# Patient Record
Sex: Female | Born: 1995 | Race: White | Hispanic: No | Smoking: Never smoker
Health system: Southern US, Community
[De-identification: ages and names within clinical notes are randomized; demographics above are authoritative.]

## PROBLEM LIST (undated history)

## (undated) DIAGNOSIS — D649 Anemia, unspecified: Secondary | ICD-10-CM

## (undated) DIAGNOSIS — R112 Nausea with vomiting, unspecified: Secondary | ICD-10-CM

## (undated) DIAGNOSIS — N83209 Unspecified ovarian cyst, unspecified side: Secondary | ICD-10-CM

## (undated) DIAGNOSIS — T8859XA Other complications of anesthesia, initial encounter: Secondary | ICD-10-CM

## (undated) DIAGNOSIS — Z8489 Family history of other specified conditions: Secondary | ICD-10-CM

## (undated) DIAGNOSIS — T4145XA Adverse effect of unspecified anesthetic, initial encounter: Secondary | ICD-10-CM

## (undated) DIAGNOSIS — R5383 Other fatigue: Secondary | ICD-10-CM

## (undated) DIAGNOSIS — F32A Depression, unspecified: Secondary | ICD-10-CM

## (undated) DIAGNOSIS — N6310 Unspecified lump in the right breast, unspecified quadrant: Secondary | ICD-10-CM

## (undated) DIAGNOSIS — Z8614 Personal history of Methicillin resistant Staphylococcus aureus infection: Secondary | ICD-10-CM

## (undated) DIAGNOSIS — S43109A Unspecified dislocation of unspecified acromioclavicular joint, initial encounter: Secondary | ICD-10-CM

## (undated) DIAGNOSIS — E785 Hyperlipidemia, unspecified: Secondary | ICD-10-CM

## (undated) DIAGNOSIS — R519 Headache, unspecified: Secondary | ICD-10-CM

## (undated) DIAGNOSIS — K529 Noninfective gastroenteritis and colitis, unspecified: Secondary | ICD-10-CM

## (undated) DIAGNOSIS — I880 Nonspecific mesenteric lymphadenitis: Secondary | ICD-10-CM

## (undated) DIAGNOSIS — R0602 Shortness of breath: Secondary | ICD-10-CM

## (undated) DIAGNOSIS — Z9889 Other specified postprocedural states: Secondary | ICD-10-CM

## (undated) DIAGNOSIS — E039 Hypothyroidism, unspecified: Secondary | ICD-10-CM

## (undated) DIAGNOSIS — F419 Anxiety disorder, unspecified: Secondary | ICD-10-CM

## (undated) DIAGNOSIS — Z91018 Allergy to other foods: Secondary | ICD-10-CM

## (undated) DIAGNOSIS — R51 Headache: Secondary | ICD-10-CM

## (undated) DIAGNOSIS — E781 Pure hyperglyceridemia: Secondary | ICD-10-CM

## (undated) DIAGNOSIS — S43005A Unspecified dislocation of left shoulder joint, initial encounter: Secondary | ICD-10-CM

## (undated) DIAGNOSIS — Z87898 Personal history of other specified conditions: Secondary | ICD-10-CM

## (undated) DIAGNOSIS — J45909 Unspecified asthma, uncomplicated: Secondary | ICD-10-CM

## (undated) DIAGNOSIS — R55 Syncope and collapse: Secondary | ICD-10-CM

## (undated) HISTORY — PX: MYRINGOTOMY: SUR874

## (undated) HISTORY — DX: Hypothyroidism, unspecified: E03.9

## (undated) HISTORY — DX: Depression, unspecified: F32.A

## (undated) HISTORY — DX: Anxiety disorder, unspecified: F41.9

## (undated) HISTORY — DX: Shortness of breath: R06.02

## (undated) HISTORY — DX: Hyperlipidemia, unspecified: E78.5

## (undated) HISTORY — PX: OTHER SURGICAL HISTORY: SHX169

## (undated) HISTORY — PX: BREAST SURGERY: SHX581

## (undated) HISTORY — DX: Other fatigue: R53.83

## (undated) HISTORY — DX: Allergy to other foods: Z91.018

## (undated) HISTORY — PX: ADENOIDECTOMY: SUR15

---
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
## 2017-01-17 ENCOUNTER — Encounter (HOSPITAL_COMMUNITY): Payer: Self-pay | Admitting: Emergency Medicine

## 2017-01-17 DIAGNOSIS — G43909 Migraine, unspecified, not intractable, without status migrainosus: Secondary | ICD-10-CM | POA: Insufficient documentation

## 2017-01-17 DIAGNOSIS — R509 Fever, unspecified: Secondary | ICD-10-CM | POA: Insufficient documentation

## 2017-01-17 DIAGNOSIS — R197 Diarrhea, unspecified: Secondary | ICD-10-CM | POA: Diagnosis not present

## 2017-01-17 DIAGNOSIS — I880 Nonspecific mesenteric lymphadenitis: Secondary | ICD-10-CM | POA: Insufficient documentation

## 2017-01-17 DIAGNOSIS — R112 Nausea with vomiting, unspecified: Secondary | ICD-10-CM | POA: Diagnosis not present

## 2017-01-17 DIAGNOSIS — Z79899 Other long term (current) drug therapy: Secondary | ICD-10-CM | POA: Insufficient documentation

## 2017-01-17 DIAGNOSIS — R1031 Right lower quadrant pain: Secondary | ICD-10-CM | POA: Diagnosis present

## 2017-01-17 LAB — COMPREHENSIVE METABOLIC PANEL
ALK PHOS: 46 U/L (ref 38–126)
ALT: 16 U/L (ref 14–54)
AST: 15 U/L (ref 15–41)
Albumin: 3.9 g/dL (ref 3.5–5.0)
Anion gap: 10 (ref 5–15)
BUN: 9 mg/dL (ref 6–20)
CHLORIDE: 103 mmol/L (ref 101–111)
CO2: 25 mmol/L (ref 22–32)
Calcium: 9.4 mg/dL (ref 8.9–10.3)
Creatinine, Ser: 0.65 mg/dL (ref 0.44–1.00)
GFR calc Af Amer: >60 mL/min (ref >60)
GFR calc non Af Amer: >60 mL/min (ref >60)
GLUCOSE: 107 mg/dL — AB (ref 65–99)
POTASSIUM: 3.9 mmol/L (ref 3.5–5.1)
Sodium: 138 mmol/L (ref 135–145)
Total Bilirubin: 0.3 mg/dL (ref 0.3–1.2)
Total Protein: 6.9 g/dL (ref 6.5–8.1)

## 2017-01-17 LAB — URINALYSIS, ROUTINE W REFLEX MICROSCOPIC
BACTERIA UA: NONE SEEN
BILIRUBIN URINE: NEGATIVE
Glucose, UA: NEGATIVE mg/dL
Ketones, ur: NEGATIVE mg/dL
Leukocytes, UA: NEGATIVE
Nitrite: NEGATIVE
Protein, ur: NEGATIVE mg/dL
Specific Gravity, Urine: 1.018 (ref 1.005–1.030)
Squamous Epithelial / LPF: NONE SEEN
pH: 5 (ref 5.0–8.0)

## 2017-01-17 LAB — CBC
HEMATOCRIT: 41.4 % (ref 36.0–46.0)
Hemoglobin: 14.1 g/dL (ref 12.0–15.0)
MCH: 31 pg (ref 26.0–34.0)
MCHC: 34.1 g/dL (ref 30.0–36.0)
MCV: 91 fL (ref 78.0–100.0)
Platelets: 273 10*3/uL (ref 150–400)
RBC: 4.55 MIL/uL (ref 3.87–5.11)
RDW: 11.7 % (ref 11.5–15.5)
WBC: 6.7 10*3/uL (ref 4.0–10.5)

## 2017-01-17 LAB — I-STAT BETA HCG BLOOD, ED (MC, WL, AP ONLY): I-stat hCG, quantitative: <5 m[IU]/mL (ref <5)

## 2017-01-17 LAB — LIPASE, BLOOD: Lipase: 30 U/L (ref 11–51)

## 2017-01-17 NOTE — ED Triage Notes (Signed)
Patient reports migraine headache yesterday and RLQ pain today with emesis and diarrhea , fever yesterday with fatigue .

## 2017-01-18 ENCOUNTER — Emergency Department (HOSPITAL_COMMUNITY)
Admission: EM | Admit: 2017-01-18 | Discharge: 2017-01-18 | Disposition: A | Discharge status: Home / Self Care | Payer: BLUE CROSS/BLUE SHIELD | Attending: Emergency Medicine | Admitting: Emergency Medicine

## 2017-01-18 ENCOUNTER — Emergency Department (HOSPITAL_COMMUNITY): Payer: BLUE CROSS/BLUE SHIELD

## 2017-01-18 DIAGNOSIS — I880 Nonspecific mesenteric lymphadenitis: Secondary | ICD-10-CM

## 2017-01-18 HISTORY — DX: Nonspecific mesenteric lymphadenitis: I88.0

## 2017-01-18 MED ORDER — IBUPROFEN 800 MG PO TABS
800.0000 mg | ORAL_TABLET | Freq: Once | ORAL | Status: AC
  Administered 2017-01-18: 800 mg via ORAL
  Filled 2017-01-18: qty 1

## 2017-01-18 MED ORDER — SODIUM CHLORIDE 0.9 % IV BOLUS (SEPSIS)
1000.0000 mL | Freq: Once | INTRAVENOUS | Status: AC
  Administered 2017-01-18: 1000 mL via INTRAVENOUS

## 2017-01-18 MED ORDER — IBUPROFEN 800 MG PO TABS: 800.0000 mg | ORAL_TABLET | Freq: Three times a day (TID) | ORAL | 0 refills | Status: DC

## 2017-01-18 MED ORDER — IOPAMIDOL (ISOVUE-300) INJECTION 61%
INTRAVENOUS | Status: AC
  Administered 2017-01-18: 100 mL
  Filled 2017-01-18: qty 100

## 2017-01-18 MED ORDER — ONDANSETRON HCL 4 MG/2ML IJ SOLN
4.0000 mg | Freq: Once | INTRAMUSCULAR | Status: AC
  Administered 2017-01-18: 4 mg via INTRAVENOUS
  Filled 2017-01-18: qty 2

## 2017-01-18 MED ORDER — MORPHINE SULFATE (PF) 4 MG/ML IV SOLN
4.0000 mg | Freq: Once | INTRAVENOUS | Status: AC
  Administered 2017-01-18: 4 mg via INTRAVENOUS
  Filled 2017-01-18: qty 1
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
## 2017-01-18 NOTE — ED Notes (Signed)
Pt returned from CT.  States that she feels much better.

## 2017-01-18 NOTE — ED Notes (Signed)
Called CT to see where pt is on the list. CT states that they are next.

## 2017-01-18 NOTE — ED Notes (Signed)
Patient transported to CT 

## 2017-04-11 ENCOUNTER — Encounter (HOSPITAL_COMMUNITY): Payer: Self-pay | Admitting: Emergency Medicine

## 2017-04-11 DIAGNOSIS — Z5321 Procedure and treatment not carried out due to patient leaving prior to being seen by health care provider: Secondary | ICD-10-CM | POA: Insufficient documentation

## 2017-04-11 DIAGNOSIS — R51 Headache: Secondary | ICD-10-CM | POA: Diagnosis present
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
## 2017-04-12 ENCOUNTER — Emergency Department (HOSPITAL_COMMUNITY)
Admission: EM | Admit: 2017-04-12 | Discharge: 2017-04-12 | Disposition: A | Discharge status: Home / Self Care | Payer: BLUE CROSS/BLUE SHIELD | Attending: Emergency Medicine | Admitting: Emergency Medicine

## 2017-11-06 ENCOUNTER — Encounter (HOSPITAL_COMMUNITY): Payer: Self-pay

## 2017-11-06 ENCOUNTER — Emergency Department (HOSPITAL_COMMUNITY): Payer: BLUE CROSS/BLUE SHIELD

## 2017-11-06 ENCOUNTER — Emergency Department (HOSPITAL_COMMUNITY)
Admission: EM | Admit: 2017-11-06 | Discharge: 2017-11-07 | Disposition: A | Discharge status: Home / Self Care | Payer: BLUE CROSS/BLUE SHIELD | Attending: Emergency Medicine | Admitting: Emergency Medicine

## 2017-11-06 DIAGNOSIS — K529 Noninfective gastroenteritis and colitis, unspecified: Secondary | ICD-10-CM | POA: Insufficient documentation

## 2017-11-06 DIAGNOSIS — R109 Unspecified abdominal pain: Secondary | ICD-10-CM | POA: Diagnosis present

## 2017-11-06 DIAGNOSIS — Z79899 Other long term (current) drug therapy: Secondary | ICD-10-CM | POA: Diagnosis not present

## 2017-11-06 DIAGNOSIS — A084 Viral intestinal infection, unspecified: Secondary | ICD-10-CM

## 2017-11-06 LAB — COMPREHENSIVE METABOLIC PANEL
ALT: 25 U/L (ref 0–44)
AST: 19 U/L (ref 15–41)
Albumin: 4.3 g/dL (ref 3.5–5.0)
Alkaline Phosphatase: 55 U/L (ref 38–126)
Anion gap: 7 (ref 5–15)
BUN: 9 mg/dL (ref 6–20)
CO2: 25 mmol/L (ref 22–32)
Calcium: 9.3 mg/dL (ref 8.9–10.3)
Chloride: 106 mmol/L (ref 98–111)
Creatinine, Ser: 0.69 mg/dL (ref 0.44–1.00)
GFR calc Af Amer: >60 mL/min (ref >60)
GFR calc non Af Amer: >60 mL/min (ref >60)
Glucose, Bld: 86 mg/dL (ref 70–99)
Potassium: 3.6 mmol/L (ref 3.5–5.1)
Sodium: 138 mmol/L (ref 135–145)
Total Bilirubin: 0.7 mg/dL (ref 0.3–1.2)
Total Protein: 7.2 g/dL (ref 6.5–8.1)

## 2017-11-06 LAB — URINALYSIS, ROUTINE W REFLEX MICROSCOPIC
Bilirubin Urine: NEGATIVE
Glucose, UA: NEGATIVE mg/dL
Hgb urine dipstick: NEGATIVE
Ketones, ur: NEGATIVE mg/dL
Leukocytes, UA: NEGATIVE
Nitrite: NEGATIVE
Protein, ur: NEGATIVE mg/dL
Specific Gravity, Urine: 1.015 (ref 1.005–1.030)
pH: 5 (ref 5.0–8.0)

## 2017-11-06 LAB — CBC WITH DIFFERENTIAL/PLATELET
Abs Immature Granulocytes: 0 10*3/uL (ref 0.0–0.1)
Basophils Absolute: 0.1 10*3/uL (ref 0.0–0.1)
Basophils Relative: 1 %
Eosinophils Absolute: 0.1 10*3/uL (ref 0.0–0.7)
Eosinophils Relative: 1 %
HCT: 41 % (ref 36.0–46.0)
Hemoglobin: 13.2 g/dL (ref 12.0–15.0)
Immature Granulocytes: 0 %
Lymphocytes Relative: 28 %
Lymphs Abs: 2.2 10*3/uL (ref 0.7–4.0)
MCH: 28.7 pg (ref 26.0–34.0)
MCHC: 32.2 g/dL (ref 30.0–36.0)
MCV: 89.1 fL (ref 78.0–100.0)
Monocytes Absolute: 0.5 10*3/uL (ref 0.1–1.0)
Monocytes Relative: 6 %
Neutro Abs: 4.9 10*3/uL (ref 1.7–7.7)
Neutrophils Relative %: 64 %
Platelets: 296 10*3/uL (ref 150–400)
RBC: 4.6 MIL/uL (ref 3.87–5.11)
RDW: 12.7 % (ref 11.5–15.5)
WBC: 7.7 10*3/uL (ref 4.0–10.5)

## 2017-11-06 LAB — POC OCCULT BLOOD, ED: Fecal Occult Bld: NEGATIVE

## 2017-11-06 LAB — LIPASE, BLOOD: Lipase: 34 U/L (ref 11–51)

## 2017-11-06 LAB — I-STAT BETA HCG BLOOD, ED (MC, WL, AP ONLY): I-stat hCG, quantitative: <5 m[IU]/mL (ref <5)

## 2017-11-06 MED ORDER — MORPHINE SULFATE (PF) 4 MG/ML IV SOLN
4.0000 mg | Freq: Once | INTRAVENOUS | Status: AC
  Administered 2017-11-06: 4 mg via INTRAVENOUS
  Filled 2017-11-06: qty 1

## 2017-11-06 MED ORDER — ONDANSETRON 4 MG PO TBDP
4.0000 mg | ORAL_TABLET | Freq: Once | ORAL | Status: AC
  Administered 2017-11-06: 4 mg via ORAL
  Filled 2017-11-06: qty 1

## 2017-11-06 MED ORDER — IOHEXOL 300 MG/ML  SOLN
100.0000 mL | Freq: Once | INTRAMUSCULAR | Status: AC | PRN
Start: 2017-11-06 — End: 2017-11-06
  Administered 2017-11-06: 100 mL via INTRAVENOUS

## 2017-11-06 NOTE — ED Provider Notes (Signed)
[facility] EMERGENCY DEPARTMENT Provider Note   CSN: [id number] Arrival date & time: 11/06/17  1844     History   Chief Complaint Chief Complaint  Patient presents with  . Abdominal Pain  . Blood In Stools    HPI Dawn Hartman is a 22 y.o. female.   22 year old female with a history of ovarian cyst presents to the emergency department for 2 days of right-sided abdominal pain.  Pain has been sharp and waxing and waning in severity.  It has been associated with nausea, vomiting, diarrhea.  She reports that her last episode of emesis was around 1400.  She denies any bloody or bilious emesis, but has had some bright red blood streaked with brown, watery stool.  Denies any recent travel or associated fevers.  No questionable food ingestion.  She took over-the-counter medication for pain without relief.  No urinary symptoms, history of abdominal surgeries, sick contacts.     Past Medical History:  Diagnosis Date  . Breast mass, right 03/2012  . High triglycerides    takes fish oil supplement  . History of MRSA infection age 70   finger  . Ovarian cyst     There are no active problems to display for this patient.   Past Surgical History:  Procedure Laterality Date  . ADENOIDECTOMY    . BREAST LUMPECTOMY WITH NEEDLE LOCALIZATION  04/15/2012   Procedure: BREAST LUMPECTOMY WITH NEEDLE LOCALIZATION;  Surgeon: Rolm Bookbinder, MD;  Location: [facility];  Service: General;  Laterality: Right;  right breast wire localized mass excision  . MYRINGOTOMY  04/1999; 04/2002  . TONSILLECTOMY  03/2010     OB History   None      Home Medications    Prior to Admission medications   Medication Sig Start Date End Date Taking? Authorizing Provider  dicyclomine (BENTYL) 20 MG tablet Take 1 tablet (20 mg total) by mouth every 12 (twelve) hours as needed (for abdominal pain/cramping). 11/07/17   Antonietta Breach, PA-C  ibuprofen (ADVIL,MOTRIN) 800 MG tablet  Take 1 tablet (800 mg total) by mouth 3 (three) times daily. 01/18/17   Montine Circle, PA-C  loratadine (CLARITIN) 10 MG tablet Take 10 mg by mouth daily.    [provider]  Norethin Ace-Eth Estrad-FE (MELODETTA 24 FE) 1-20 MG-MCG(24) CHEW Chew 1 tablet by mouth daily.    [provider]  ondansetron (ZOFRAN ODT) 4 MG disintegrating tablet Take 1 tablet (4 mg total) by mouth every 8 (eight) hours as needed for nausea or vomiting. 11/07/17   Antonietta Breach, PA-C  PRESCRIPTION MEDICATION Take 4 tablets by mouth at bedtime. Headache medication    [provider]    Family History Family History  Problem Relation Age of Onset  . Heart disease Paternal Grandfather        atrial fib.  . Asthma Paternal Grandfather        as a child  . Diabetes Maternal Grandmother   . Hypertension Maternal Grandmother   . Leukemia Maternal Grandfather   . Cancer Paternal Grandmother        breast CA  . Anesthesia problems Mother        post-op N/V  . Asthma Sister     Social History Social History   Tobacco Use  . Smoking status: Never Smoker  . Smokeless tobacco: Never Used  Substance Use Topics  . Alcohol use: No  . Drug use: No     Allergies   Patient  has no known allergies.   Review of Systems Review of Systems Ten systems reviewed and are negative for acute change, except as noted in the HPI.    Physical Exam Updated Vital Signs BP 102/62   Pulse 86   Temp 98.9 F (37.2 C) (Oral)   Resp 17   LMP 10/22/2017   SpO2 99%   Physical Exam  Constitutional: She is oriented to person, place, and time. She appears well-developed and well-nourished. No distress.  Nontoxic appearing and in NAD  HENT:  Head: Normocephalic and atraumatic.  Eyes: Conjunctivae and EOM are normal. No scleral icterus.  Neck: Normal range of motion.  Cardiovascular: Normal rate, regular rhythm and intact distal pulses.  Pulmonary/Chest: Effort normal. No respiratory distress.    Respirations even and unlabored  Abdominal: She exhibits no mass. There is tenderness.  Soft abdomen. Slightly hyperactive bowel sounds. TTP in the RLQ, mildly in the RUQ. Negative Murphy's sign. No peritoneal signs.  Genitourinary: Rectum normal.  Genitourinary Comments: No anal fissure or bleeding hemorrhoid. Normal rectal tone. No melena or hematochezia on chaperoned DRE.   Musculoskeletal: Normal range of motion.  Neurological: She is alert and oriented to person, place, and time. She exhibits normal muscle tone. Coordination normal.  GCS 15. Moving all extremities spontaneously.  Skin: Skin is warm and dry. No rash noted. She is not diaphoretic. No erythema. No pallor.  Psychiatric: She has a normal mood and affect. Her behavior is normal.  Nursing note and vitals reviewed.    ED Treatments / Results  Labs (all labs ordered are listed, but only abnormal results are displayed) Labs Reviewed  COMPREHENSIVE METABOLIC PANEL  LIPASE, BLOOD  CBC WITH DIFFERENTIAL/PLATELET  URINALYSIS, ROUTINE W REFLEX MICROSCOPIC  I-STAT BETA HCG BLOOD, ED ([facility], [facility], [facility] ONLY)  POC OCCULT BLOOD, ED    EKG None  Radiology Ct Abdomen Pelvis W Contrast  Result Date: 11/07/2017 CLINICAL DATA:  22 y/o F; right lower quadrant abdominal pain with nausea, vomiting, diarrhea, and hematochezia for 2 days. EXAM: CT ABDOMEN AND PELVIS WITH CONTRAST TECHNIQUE: Multidetector CT imaging of the abdomen and pelvis was performed using the standard protocol following bolus administration of intravenous contrast. CONTRAST:  16mL OMNIPAQUE IOHEXOL 300 MG/ML  SOLN COMPARISON:  01/18/2017 CT of abdomen and pelvis. FINDINGS: Lower chest: No acute abnormality. Hepatobiliary: No focal liver abnormality is seen. No gallstones, gallbladder wall thickening, or biliary dilatation. Pancreas: Unremarkable. No pancreatic ductal dilatation or surrounding inflammatory changes. Spleen: Normal in size without focal abnormality.  Adrenals/Urinary Tract: Adrenal glands are unremarkable. Kidneys are normal, without renal calculi, focal lesion, or hydronephrosis. Bladder is unremarkable. Stomach/Bowel: Stomach is within normal limits. Appendix appears normal. No evidence of bowel wall thickening, distention, or inflammatory changes. Vascular/Lymphatic: No significant vascular findings are present. No enlarged abdominal or pelvic lymph nodes. Reproductive: Uterus and bilateral adnexa are unremarkable. Other: No abdominal wall hernia or abnormality. No abdominopelvic ascites. Musculoskeletal: No acute or significant osseous findings. IMPRESSION: Stable normal CT of the abdomen and pelvis. No acute process identified. Electronically Signed   By: Kristine Garbe M.D.   On: 11/07/2017 00:21    Procedures Procedures (including critical care time)  Medications Ordered in ED Medications  ondansetron (ZOFRAN-ODT) disintegrating tablet 4 mg (4 mg Oral Given 11/06/17 1953)  morphine 4 MG/ML injection 4 mg (4 mg Intravenous Given 11/06/17 1953)  iohexol (OMNIPAQUE) 300 MG/ML solution 100 mL (100 mLs Intravenous Contrast Given 11/06/17 2339)     Initial Impression / Assessment and  Plan / ED Course  I have reviewed the triage vital signs and the nursing notes.  Pertinent labs & imaging results that were available during my care of the patient were reviewed by me and considered in my medical decision making (see chart for details).     Patient with symptoms consistent with viral gastroenteritis.  Vitals are stable, no fever.  No signs of dehydration, tolerating PO fluids.  Lungs are clear.  Laboratory work up reassuring and CT without acute process in the abdomen or pelvis. No concern for appendicitis, cholecystitis, pancreatitis, ruptured viscus, UTI, kidney stone, or any other abdominal etiology.  Supportive therapy indicated with return if symptoms worsen.  Will discharge with Zofran and Bentyl.  Return precautions discussed and  provided. Patient discharged in stable condition with no unaddressed concerns.   Final Clinical Impressions(s) / ED Diagnoses   Final diagnoses:  Viral gastroenteritis    ED Discharge Orders        Ordered    ondansetron (ZOFRAN ODT) 4 MG disintegrating tablet  Every 8 hours PRN     11/07/17 0033    dicyclomine (BENTYL) 20 MG tablet  Every 12 hours PRN     11/07/17 0033       Antonietta Breach, PA-C 11/07/17 4403    Dina Rich, Barbette Hair, MD 11/07/17 [phone]

## 2017-11-06 NOTE — ED Triage Notes (Signed)
Onset yesterday blood in stool, abd pain on right side, onset last night vomiting- last 10:30am, and nausea.

## 2017-11-06 NOTE — ED Provider Notes (Signed)
Patient placed in Quick Look pathway, seen and evaluated   Chief Complaint: Abdominal Pain, N/V/D  HPI:   Patient presents with a 2 day history of worsening abdominal pain. It is mostly in the RLQ, however has had some radiation. She reports diarrhea and reports blood in her stool all yesterday. She reports "borderline appendicitis" in the past, as well as mesenteric adenitis, and ovarian cyst. No known fever. Sent from Urgent Care for further workup  ROS: Abdominal pain, N/V/D, rectal bleeding  Physical Exam:   Gen: No distress  Neuro: Awake and Alert  Skin: Warm    Focused Exam: McBurney's point tenderness as well as RUQ tenderness, +Rovsing's sign, suprapubic tenderness as well but most tender at RLQ; lungs CTA, heart normal RR   Initiation of care has begun. The patient has been counseled on the process, plan, and necessity for staying for the completion/evaluation, and the remainder of the medical screening examination    Caryl Ada 11/06/17 1933    Dorie Rank, MD 11/07/17 [phone]

## 2017-11-07 MED ORDER — METOCLOPRAMIDE HCL 5 MG/ML IJ SOLN: 10.0000 mg | INTRAMUSCULAR | Status: DC

## 2017-11-07 MED ORDER — ONDANSETRON 4 MG PO TBDP: 4.0000 mg | ORAL_TABLET | Freq: Three times a day (TID) | ORAL | 0 refills | Status: DC | PRN

## 2017-11-07 MED ORDER — KETOROLAC TROMETHAMINE 30 MG/ML IJ SOLN: 30.0000 mg | Freq: Once | INTRAMUSCULAR | Status: DC

## 2017-11-07 MED ORDER — DICYCLOMINE HCL 20 MG PO TABS: 20.0000 mg | ORAL_TABLET | Freq: Two times a day (BID) | ORAL | 0 refills | Status: DC | PRN

## 2017-11-07 NOTE — ED Notes (Signed)
Pt given crackers and sprite.

## 2017-11-07 NOTE — Discharge Instructions (Signed)
Your blood work and CT scan today were normal.  We suspect that your symptoms are due to a viral illness.  Avoid fried foods, fatty foods, greasy foods, and milk products until symptoms resolve. Be sure your child drinks plenty of clear liquids. We recommend the use of Zofran as prescribed for nausea/vomiting. Take 600mg  ibuprofen eery 6 hours for pain. You may supplement this with Bentyl as needed for abdominal cramping. Follow-up with your primary care doctor to ensure resolution of symptoms.

## 2017-11-28 ENCOUNTER — Encounter (HOSPITAL_COMMUNITY): Payer: Self-pay | Admitting: Emergency Medicine

## 2017-11-28 ENCOUNTER — Other Ambulatory Visit: Payer: Self-pay

## 2017-11-28 ENCOUNTER — Ambulatory Visit (HOSPITAL_COMMUNITY)
Admission: EM | Admit: 2017-11-28 | Discharge: 2017-11-28 | Disposition: A | Discharge status: Home / Self Care | Payer: BLUE CROSS/BLUE SHIELD | Attending: Family Medicine | Admitting: Family Medicine

## 2017-11-28 DIAGNOSIS — R040 Epistaxis: Secondary | ICD-10-CM

## 2017-11-28 DIAGNOSIS — R55 Syncope and collapse: Secondary | ICD-10-CM

## 2017-11-28 LAB — POCT I-STAT, CHEM 8
BUN: 12 mg/dL (ref 6–20)
CALCIUM ION: 1.25 mmol/L (ref 1.15–1.40)
Chloride: 103 mmol/L (ref 98–111)
Creatinine, Ser: 0.7 mg/dL (ref 0.44–1.00)
Glucose, Bld: 97 mg/dL (ref 70–99)
HEMATOCRIT: 40 % (ref 36.0–46.0)
HEMOGLOBIN: 13.6 g/dL (ref 12.0–15.0)
Potassium: 4.2 mmol/L (ref 3.5–5.1)
SODIUM: 139 mmol/L (ref 135–145)
TCO2: 26 mmol/L (ref 22–32)

## 2017-11-28 MED ORDER — OXYMETAZOLINE HCL 0.05 % NA SOLN
NASAL | Status: AC
  Filled 2017-11-28: qty 15

## 2017-11-28 NOTE — ED Provider Notes (Addendum)
[place]   [id number] 11/28/17 Arrival Time: 1320  ASSESSMENT & PLAN:  1. Epistaxis   2. Vasovagal episode    Reassured that I do not feel her isolated vasovagal episode without recurrence is predictive of a more serious condition. She will call her ENT to continue treating her recurrent nosebleeds.  Labs today unremarkable. Declines ECG. Should she have further episodes I would recommend f/u with cardiology.  Follow-up Information    Elnita Maxwell, MD.   Specialty:  Pediatrics Why:  As needed or if you have another fainting episode. Contact information: [address], [address] [place] [address] [phone]          Reviewed expectations re: course of current medical issues. Questions answered. Outlined signs and symptoms indicating need for more acute intervention. Patient verbalized understanding. After Visit Summary given.  SUBJECTIVE: History from: patient. Dawn Hartman is a 22 y.o. female with a long history of recurrent nosebleeds that are often difficult to control. Requires cauterization about every six months. Over the past 2-3 days reports frequent bilateral epistaxis. Uses pressure and Afrin at home, usually with response after several minutes. Three days ago reports significant nosebleed that was difficult to control. Helped to bathroom by family. Sitting on toilet. "Felt lightheaded." Remembers falling forward. Fiancee there to catch her. No head injury. Groggy for several minutes. No CP/SOB/n/v associated. No h/o of fainting. No further episodes. No current nosebleed. ENT sent her for evaluation before seeing her.  ROS: As per HPI. All other systems negative.  OBJECTIVE:  Vitals:   11/28/17 1349  BP: 121/77  Pulse: 90  Temp: 98.7 F (37.1 C)  TempSrc: Oral  SpO2: 100%     Glascow Coma Scale: 15  General appearance: alert; no distress HEENT: normocephalic; atraumatic; conjunctivae normal;  TMs normal; oral mucosa normal; sporadic bleeding from L nare that is controlled with pressure and Afrin Neck: supple with FROM Lungs: clear to auscultation bilaterally Heart: regular rate and rhythm Abdomen: soft, non-tender Back: no midline tenderness Extremities: moves all extremities normally; no cyanosis or edema; symmetrical with no gross deformities Skin: warm and dry Neurologic: normal gait Psychological: alert and cooperative; normal mood and affect  Results for orders placed or performed during the hospital encounter of 11/06/17  Comprehensive metabolic panel  Result Value Ref Range   Sodium 138 135 - 145 mmol/L   Potassium 3.6 3.5 - 5.1 mmol/L   Chloride 106 98 - 111 mmol/L   CO2 25 22 - 32 mmol/L   Glucose, Bld 86 70 - 99 mg/dL   BUN 9 6 - 20 mg/dL   Creatinine, Ser 0.69 0.44 - 1.00 mg/dL   Calcium 9.3 8.9 - 10.3 mg/dL   Total Protein 7.2 6.5 - 8.1 g/dL   Albumin 4.3 3.5 - 5.0 g/dL   AST 19 15 - 41 U/L   ALT 25 0 - 44 U/L   Alkaline Phosphatase 55 38 - 126 U/L   Total Bilirubin 0.7 0.3 - 1.2 mg/dL   GFR calc non Af Amer >60 >60 mL/min   GFR calc Af Amer >60 >60 mL/min   Anion gap 7 5 - 15  Lipase, blood  Result Value Ref Range   Lipase 34 11 - 51 U/L  CBC with Differential  Result Value Ref Range   WBC 7.7 4.0 - 10.5 K/uL   RBC 4.60 3.87 - 5.11 MIL/uL   Hemoglobin 13.2 12.0 - 15.0 g/dL   HCT 41.0 36.0 -  46.0 %   MCV 89.1 78.0 - 100.0 fL   MCH 28.7 26.0 - 34.0 pg   MCHC 32.2 30.0 - 36.0 g/dL   RDW 12.7 11.5 - 15.5 %   Platelets 296 150 - 400 K/uL   Neutrophils Relative % 64 %   Neutro Abs 4.9 1.7 - 7.7 K/uL   Lymphocytes Relative 28 %   Lymphs Abs 2.2 0.7 - 4.0 K/uL   Monocytes Relative 6 %   Monocytes Absolute 0.5 0.1 - 1.0 K/uL   Eosinophils Relative 1 %   Eosinophils Absolute 0.1 0.0 - 0.7 K/uL   Basophils Relative 1 %   Basophils Absolute 0.1 0.0 - 0.1 K/uL   Immature Granulocytes 0 %   Abs Immature Granulocytes 0.0 0.0 - 0.1 K/uL  Urinalysis,  Routine w reflex microscopic  Result Value Ref Range   Color, Urine YELLOW YELLOW   APPearance CLEAR CLEAR   Specific Gravity, Urine 1.015 1.005 - 1.030   pH 5.0 5.0 - 8.0   Glucose, UA NEGATIVE NEGATIVE mg/dL   Hgb urine dipstick NEGATIVE NEGATIVE   Bilirubin Urine NEGATIVE NEGATIVE   Ketones, ur NEGATIVE NEGATIVE mg/dL   Protein, ur NEGATIVE NEGATIVE mg/dL   Nitrite NEGATIVE NEGATIVE   Leukocytes, UA NEGATIVE NEGATIVE  I-Stat beta hCG blood, ED  Result Value Ref Range   I-stat hCG, quantitative <5.0 <5 mIU/mL   Comment 3          POC occult blood, ED Provider will collect  Result Value Ref Range   Fecal Occult Bld NEGATIVE NEGATIVE    No Known Allergies   Past Medical History:  Diagnosis Date  . Breast mass, right 03/2012  . High triglycerides    takes fish oil supplement  . History of MRSA infection age 39   finger  . Ovarian cyst    Past Surgical History:  Procedure Laterality Date  . ADENOIDECTOMY    . BREAST LUMPECTOMY WITH NEEDLE LOCALIZATION  04/15/2012   Procedure: BREAST LUMPECTOMY WITH NEEDLE LOCALIZATION;  Surgeon: Rolm Bookbinder, MD;  Location: [facility];  Service: General;  Laterality: Right;  right breast wire localized mass excision  . MYRINGOTOMY  04/1999; 04/2002  . TONSILLECTOMY  03/2010   Family History  Problem Relation Age of Onset  . Heart disease Paternal Grandfather        atrial fib.  . Asthma Paternal Grandfather        as a child  . Diabetes Maternal Grandmother   . Hypertension Maternal Grandmother   . Leukemia Maternal Grandfather   . Cancer Paternal Grandmother        breast CA  . Anesthesia problems Mother        post-op N/V  . Asthma Sister     Social History   Socioeconomic History  . Marital status: Single    Spouse name: Not on file  . Number of children: Not on file  . Years of education: Not on file  . Highest education level: Not on file  Occupational History  . Not on file  Social Needs  .  Financial resource strain: Not on file  . Food insecurity:    Worry: Not on file    Inability: Not on file  . Transportation needs:    Medical: Not on file    Non-medical: Not on file  Tobacco Use  . Smoking status: Never Smoker  . Smokeless tobacco: Never Used  Substance and Sexual Activity  . Alcohol use: No  .  Drug use: No  . Sexual activity: Never    Birth control/protection: Abstinence  Lifestyle  . Physical activity:    Days per week: Not on file    Minutes per session: Not on file  . Stress: Not on file  Relationships  . Social connections:    Talks on phone: Not on file    Gets together: Not on file    Attends religious service: Not on file    Active member of club or organization: Not on file    Attends meetings of clubs or organizations: Not on file    Relationship status: Not on file  Other Topics Concern  . Not on file  Social History Narrative  . Not on file          Vanessa Kick, MD 11/29/17 [place]    Vanessa Kick, MD 11/29/17 1054

## 2017-11-28 NOTE — ED Triage Notes (Signed)
Pt has history of epistaxis and goes to her ENT for cauterization.  She called her ENT to be seen this week because her nose bleeds have been worse than usual and she passed out on Monday.  Pt called her ENT and they stated they wanted her to have lab work done, but did not specify what labs.  They told her to get checked out by [place] and for [place] to decide what labs will be needed.

## 2017-12-08 ENCOUNTER — Emergency Department (HOSPITAL_COMMUNITY)
Admission: EM | Admit: 2017-12-08 | Discharge: 2017-12-09 | Disposition: A | Discharge status: Home / Self Care | Payer: BLUE CROSS/BLUE SHIELD | Attending: Emergency Medicine | Admitting: Emergency Medicine

## 2017-12-08 ENCOUNTER — Emergency Department (HOSPITAL_COMMUNITY): Payer: BLUE CROSS/BLUE SHIELD

## 2017-12-08 ENCOUNTER — Other Ambulatory Visit: Payer: Self-pay

## 2017-12-08 DIAGNOSIS — Z79899 Other long term (current) drug therapy: Secondary | ICD-10-CM | POA: Diagnosis not present

## 2017-12-08 DIAGNOSIS — Y9352 Activity, horseback riding: Secondary | ICD-10-CM | POA: Diagnosis not present

## 2017-12-08 DIAGNOSIS — Y999 Unspecified external cause status: Secondary | ICD-10-CM | POA: Insufficient documentation

## 2017-12-08 DIAGNOSIS — Y929 Unspecified place or not applicable: Secondary | ICD-10-CM | POA: Insufficient documentation

## 2017-12-08 DIAGNOSIS — S43102A Unspecified dislocation of left acromioclavicular joint, initial encounter: Secondary | ICD-10-CM | POA: Diagnosis not present

## 2017-12-08 DIAGNOSIS — S4992XA Unspecified injury of left shoulder and upper arm, initial encounter: Secondary | ICD-10-CM | POA: Diagnosis present

## 2017-12-08 DIAGNOSIS — S43109A Unspecified dislocation of unspecified acromioclavicular joint, initial encounter: Secondary | ICD-10-CM

## 2017-12-08 HISTORY — DX: Unspecified dislocation of unspecified acromioclavicular joint, initial encounter: S43.109A

## 2017-12-08 LAB — POC URINE PREG, ED: PREG TEST UR: NEGATIVE

## 2017-12-08 MED ORDER — ACETAMINOPHEN 325 MG PO TABS
650.0000 mg | ORAL_TABLET | Freq: Once | ORAL | Status: AC
  Administered 2017-12-08: 650 mg via ORAL
  Filled 2017-12-08: qty 2

## 2017-12-08 MED ORDER — MORPHINE SULFATE (PF) 4 MG/ML IV SOLN
4.0000 mg | Freq: Once | INTRAVENOUS | Status: AC
Start: 2017-12-08 — End: 2017-12-08
  Administered 2017-12-08: 4 mg via INTRAMUSCULAR
  Filled 2017-12-08: qty 1

## 2017-12-08 MED ORDER — IBUPROFEN 200 MG PO TABS
600.0000 mg | ORAL_TABLET | Freq: Once | ORAL | Status: AC
  Administered 2017-12-08: 23:00:00 600 mg via ORAL
  Filled 2017-12-08: qty 1

## 2017-12-08 MED ORDER — OXYCODONE-ACETAMINOPHEN 5-325 MG PO TABS
1.0000 | ORAL_TABLET | ORAL | Status: DC | PRN
  Administered 2017-12-08: 1 via ORAL
  Filled 2017-12-08: qty 1

## 2017-12-08 NOTE — ED Triage Notes (Signed)
Per pt she was on a horse today and horse and got startled and took off and pt fell off the horse and hit her head and fell on her left shoulder. Alert oriented no loc no vomiting no vision change. Md aware and said no CT of head at this time.

## 2017-12-09 MED ORDER — HYDROCODONE-ACETAMINOPHEN 5-325 MG PO TABS: 1.0000 | ORAL_TABLET | ORAL | 0 refills | Status: DC | PRN

## 2017-12-09 MED ORDER — IBUPROFEN 400 MG PO TABS: 400.0000 mg | ORAL_TABLET | Freq: Three times a day (TID) | ORAL | 0 refills | Status: DC | PRN

## 2017-12-09 NOTE — Discharge Instructions (Addendum)
Call your orthopedic surgeon for follow up

## 2017-12-09 NOTE — ED Provider Notes (Signed)
[facility] EMERGENCY DEPARTMENT Provider Note   CSN: [id number] Arrival date & time: 12/08/17  2028     History   Chief Complaint Chief Complaint  Patient presents with  . Fall    HPI Dawn Hartman is a 22 y.o. female.  HPI 22 year old female presents the emergency department after falling off of her horse today onto the left side.  She presents with severe pain in her left shoulder.  She was helmeted.  No loss of consciousness.  Denies significant headache or neck pain.  No nausea or vomiting.  No altered mental status.  Denies chest pain or shortness of breath.  No abdominal pain.  No weakness in her arms or legs.  No paresthesias.  Pain is mild to moderate in severity and located in her left shoulder.   Past Medical History:  Diagnosis Date  . Breast mass, right 03/2012  . High triglycerides    takes fish oil supplement  . History of MRSA infection age 77   finger  . Ovarian cyst     There are no active problems to display for this patient.   Past Surgical History:  Procedure Laterality Date  . ADENOIDECTOMY    . BREAST LUMPECTOMY WITH NEEDLE LOCALIZATION  04/15/2012   Procedure: BREAST LUMPECTOMY WITH NEEDLE LOCALIZATION;  Surgeon: Rolm Bookbinder, MD;  Location: [facility];  Service: General;  Laterality: Right;  right breast wire localized mass excision  . MYRINGOTOMY  04/1999; 04/2002  . TONSILLECTOMY  03/2010     OB History   None      Home Medications    Prior to Admission medications   Medication Sig Start Date End Date Taking? Authorizing Provider  dicyclomine (BENTYL) 20 MG tablet Take 1 tablet (20 mg total) by mouth every 12 (twelve) hours as needed (for abdominal pain/cramping). 11/07/17   Antonietta Breach, PA-C  HYDROcodone-acetaminophen (NORCO/VICODIN) 5-325 MG tablet Take 1 tablet by mouth every 4 (four) hours as needed for moderate pain. 12/09/17   Jola Schmidt, MD  ibuprofen (ADVIL,MOTRIN) 400 MG tablet Take 1  tablet (400 mg total) by mouth every 8 (eight) hours as needed. 12/09/17   Jola Schmidt, MD  loratadine (CLARITIN) 10 MG tablet Take 10 mg by mouth daily.    [provider]  Norethin Ace-Eth Estrad-FE (MELODETTA 24 FE) 1-20 MG-MCG(24) CHEW Chew 1 tablet by mouth daily.    [provider]  ondansetron (ZOFRAN ODT) 4 MG disintegrating tablet Take 1 tablet (4 mg total) by mouth every 8 (eight) hours as needed for nausea or vomiting. 11/07/17   Antonietta Breach, PA-C  PRESCRIPTION MEDICATION Take 4 tablets by mouth at bedtime. Headache medication    [provider]    Family History Family History  Problem Relation Age of Onset  . Heart disease Paternal Grandfather        atrial fib.  . Asthma Paternal Grandfather        as a child  . Diabetes Maternal Grandmother   . Hypertension Maternal Grandmother   . Leukemia Maternal Grandfather   . Cancer Paternal Grandmother        breast CA  . Anesthesia problems Mother        post-op N/V  . Asthma Sister     Social History Social History   Tobacco Use  . Smoking status: Never Smoker  . Smokeless tobacco: Never Used  Substance Use Topics  . Alcohol use: No  . Drug use: No  Allergies   Patient has no known allergies.   Review of Systems Review of Systems  All other systems reviewed and are negative.    Physical Exam Updated Vital Signs BP 125/72 (BP Location: Right Arm)   Pulse 98   Temp 98.9 F (37.2 C) (Oral)   Resp 16   Ht 5\' 8"  (1.727 m)   Wt 72.6 kg   LMP 11/26/2017 (Exact Date) Comment: pt shielded  SpO2 100%   BMI 24.33 kg/m   Physical Exam  Constitutional: She is oriented to person, place, and time. She appears well-developed and well-nourished. No distress.  HENT:  Head: Normocephalic and atraumatic.  Eyes: EOM are normal.  Neck: Normal range of motion. Neck supple.  C-spine nontender.  Cardiovascular: Normal rate and regular rhythm.  Pulmonary/Chest: Effort normal and breath  sounds normal.  Abdominal: Soft. She exhibits no distension. There is no tenderness.  Musculoskeletal: Normal range of motion.  Limited range of motion secondary to pain in the left shoulder.  Tenderness over the left AC joint.  No obvious clavicle abnormality's.  Normal left radial pulse.  No paresthesias in the left arm.  Neurological: She is alert and oriented to person, place, and time.  Skin: Skin is warm and dry.  Psychiatric: She has a normal mood and affect. Judgment normal.  Nursing note and vitals reviewed.    ED Treatments / Results  Labs (all labs ordered are listed, but only abnormal results are displayed) Labs Reviewed  POC URINE PREG, ED    EKG None  Radiology Dg Shoulder Left  Result Date: 12/08/2017 CLINICAL DATA:  Patient status post fall from horse. Initial encounter. EXAM: LEFT SHOULDER - 2+ VIEW COMPARISON:  None. FINDINGS: The distal clavicle is located approximately 1.2 cm superior to the acromion. No evidence for acute fracture. Visualized left hemithorax is unremarkable. IMPRESSION: Findings compatible with AC joint dislocation. Electronically Signed   By: Lovey Newcomer M.D.   On: 12/08/2017 21:39    Procedures .Splint Application Performed by: Jola Schmidt, MD Authorized by: Jola Schmidt, MD     SPLINT APPLICATION Authorized by: Jola Schmidt Consent: Verbal consent obtained. Risks and benefits: risks, benefits and alternatives were discussed Consent given by: patient Splint applied by: orthopedic technician Location details: left upper extremity Splint type: shoulder immobilizer Supplies used: Older immobilizer Post-procedure: The splinted body part was neurovascularly unchanged following the procedure. Patient tolerance: Patient tolerated the procedure well with no immediate complications.     Medications Ordered in ED Medications  morphine 4 MG/ML injection 4 mg (4 mg Intramuscular Given 12/08/17 2325)  ibuprofen (ADVIL,MOTRIN) tablet 600  mg (600 mg Oral Given 12/08/17 2325)  acetaminophen (TYLENOL) tablet 650 mg (650 mg Oral Given 12/08/17 2325)     Initial Impression / Assessment and Plan / ED Course  I have reviewed the triage vital signs and the nursing notes.  Pertinent labs & imaging results that were available during my care of the patient were reviewed by me and considered in my medical decision making (see chart for details).     Left AC joint separation.  Outpatient orthopedic follow-up.  Shoulder immobilizer.  Home with pain medication.  Patient understands return to the ER for new or worsening symptoms.  No  tenting of the skin.  Final Clinical Impressions(s) / ED Diagnoses   Final diagnoses:  Acromioclavicular joint separation, type 3, left, initial encounter    ED Discharge Orders         Ordered    ibuprofen (  ADVIL,MOTRIN) 400 MG tablet  Every 8 hours PRN     12/09/17 0038    HYDROcodone-acetaminophen (NORCO/VICODIN) 5-325 MG tablet  Every 4 hours PRN     12/09/17 6720           Jola Schmidt, MD 12/09/17 [phone]

## 2017-12-12 ENCOUNTER — Encounter (HOSPITAL_BASED_OUTPATIENT_CLINIC_OR_DEPARTMENT_OTHER): Payer: Self-pay | Admitting: *Deleted

## 2017-12-13 ENCOUNTER — Other Ambulatory Visit: Payer: Self-pay

## 2017-12-13 ENCOUNTER — Encounter (HOSPITAL_BASED_OUTPATIENT_CLINIC_OR_DEPARTMENT_OTHER): Payer: Self-pay

## 2017-12-13 NOTE — Progress Notes (Signed)
Spoke with:  Annalisa (mom) NPO:  After Midnight, no gum, candy, or mints   Arrival time:  3500XF Labs:  (UPT, Istat8 11/28/17 in epic) AM medications:  None Pre op orders: Yes Ride home:  Annalisa [phone]

## 2017-12-17 NOTE — Anesthesia Preprocedure Evaluation (Addendum)
Anesthesia Evaluation  Patient identified by MRN, date of birth, ID band Patient awake    Reviewed: Allergy & Precautions, NPO status , Patient's Chart, lab work & pertinent test results  History of Anesthesia Complications Negative for: history of anesthetic complications  Airway Mallampati: I  TM Distance: >3 FB Neck ROM: Full    Dental  (+) Dental Advisory Given, Teeth Intact,    Pulmonary asthma ,    breath sounds clear to auscultation       Cardiovascular negative cardio ROS   Rhythm:Regular Rate:Normal     Neuro/Psych  Headaches, negative psych ROS   GI/Hepatic negative GI ROS, Neg liver ROS,   Endo/Other  negative endocrine ROS  Renal/GU negative Renal ROS  negative genitourinary   Musculoskeletal negative musculoskeletal ROS (+)   Abdominal   Peds  Hematology negative hematology ROS (+)   Anesthesia Other Findings Frequent nosebleeds  Reproductive/Obstetrics                           Anesthesia Physical Anesthesia Plan  ASA: II  Anesthesia Plan: General   Post-op Pain Management:  Regional for Post-op pain   Induction: Intravenous  PONV Risk Score and Plan: 3 and Treatment may vary due to age or medical condition, Ondansetron, Dexamethasone and Midazolam  Airway Management Planned: Oral ETT  Additional Equipment: None  Intra-op Plan:   Post-operative Plan: Extubation in OR  Informed Consent: I have reviewed the patients History and Physical, chart, labs and discussed the procedure including the risks, benefits and alternatives for the proposed anesthesia with the patient or authorized representative who has indicated his/her understanding and acceptance.   Dental advisory given  Plan Discussed with: CRNA and Anesthesiologist  Anesthesia Plan Comments:       Anesthesia Quick Evaluation

## 2017-12-18 ENCOUNTER — Encounter (HOSPITAL_BASED_OUTPATIENT_CLINIC_OR_DEPARTMENT_OTHER)
Admission: RE | Disposition: A | Discharge status: Home / Self Care | Payer: Self-pay | Source: Ambulatory Visit | Attending: Orthopedic Surgery

## 2017-12-18 ENCOUNTER — Other Ambulatory Visit: Payer: Self-pay

## 2017-12-18 ENCOUNTER — Ambulatory Visit (HOSPITAL_BASED_OUTPATIENT_CLINIC_OR_DEPARTMENT_OTHER)
Admission: RE | Admit: 2017-12-18 | Discharge: 2017-12-18 | Disposition: A | Discharge status: Home / Self Care | Payer: BLUE CROSS/BLUE SHIELD | Source: Ambulatory Visit | Attending: Orthopedic Surgery | Admitting: Orthopedic Surgery

## 2017-12-18 ENCOUNTER — Ambulatory Visit (HOSPITAL_BASED_OUTPATIENT_CLINIC_OR_DEPARTMENT_OTHER): Payer: BLUE CROSS/BLUE SHIELD | Admitting: Anesthesiology

## 2017-12-18 ENCOUNTER — Encounter (HOSPITAL_BASED_OUTPATIENT_CLINIC_OR_DEPARTMENT_OTHER): Payer: Self-pay | Admitting: Anesthesiology

## 2017-12-18 DIAGNOSIS — Y9352 Activity, horseback riding: Secondary | ICD-10-CM | POA: Diagnosis not present

## 2017-12-18 DIAGNOSIS — S4992XA Unspecified injury of left shoulder and upper arm, initial encounter: Secondary | ICD-10-CM

## 2017-12-18 DIAGNOSIS — Y929 Unspecified place or not applicable: Secondary | ICD-10-CM | POA: Insufficient documentation

## 2017-12-18 DIAGNOSIS — S43102A Unspecified dislocation of left acromioclavicular joint, initial encounter: Secondary | ICD-10-CM | POA: Insufficient documentation

## 2017-12-18 HISTORY — PX: RECONSTRUCTION OF CORACOCLAVICULAR LIGAMENT: SHX6045

## 2017-12-18 HISTORY — DX: Headache: R51

## 2017-12-18 HISTORY — DX: Unspecified asthma, uncomplicated: J45.909

## 2017-12-18 HISTORY — DX: Personal history of other specified conditions: Z87.898

## 2017-12-18 HISTORY — DX: Unspecified dislocation of left shoulder joint, initial encounter: S43.005A

## 2017-12-18 HISTORY — DX: Anemia, unspecified: D64.9

## 2017-12-18 HISTORY — DX: Noninfective gastroenteritis and colitis, unspecified: K52.9

## 2017-12-18 HISTORY — DX: Nonspecific mesenteric lymphadenitis: I88.0

## 2017-12-18 HISTORY — DX: Headache, unspecified: R51.9

## 2017-12-18 HISTORY — DX: Syncope and collapse: R55

## 2017-12-18 HISTORY — DX: Unspecified dislocation of unspecified acromioclavicular joint, initial encounter: S43.109A

## 2017-12-18 LAB — POCT PREGNANCY, URINE: Preg Test, Ur: NEGATIVE

## 2017-12-18 SURGERY — RECONSTRUCTION, LIGAMENT, CORACOCLAVICULAR
Anesthesia: General | Site: Shoulder | Laterality: Left

## 2017-12-18 MED ORDER — PHENYLEPHRINE HCL 10 MG/ML IJ SOLN
INTRAMUSCULAR | Status: AC
  Filled 2017-12-18: qty 1

## 2017-12-18 MED ORDER — ONDANSETRON HCL 4 MG PO TABS: 4.0000 mg | ORAL_TABLET | Freq: Three times a day (TID) | ORAL | 0 refills | Status: DC | PRN

## 2017-12-18 MED ORDER — GABAPENTIN 300 MG PO CAPS
300.0000 mg | ORAL_CAPSULE | Freq: Once | ORAL | Status: DC
  Filled 2017-12-18: qty 1

## 2017-12-18 MED ORDER — ACETAMINOPHEN 500 MG PO TABS
1000.0000 mg | ORAL_TABLET | Freq: Four times a day (QID) | ORAL | Status: DC | PRN
  Administered 2017-12-18: 1000 mg via ORAL
  Filled 2017-12-18: qty 2

## 2017-12-18 MED ORDER — LIDOCAINE 2% (20 MG/ML) 5 ML SYRINGE
INTRAMUSCULAR | Status: AC
  Filled 2017-12-18: qty 5

## 2017-12-18 MED ORDER — DEXAMETHASONE SODIUM PHOSPHATE 10 MG/ML IJ SOLN
INTRAMUSCULAR | Status: DC | PRN
  Administered 2017-12-18: 10 mg via INTRAVENOUS

## 2017-12-18 MED ORDER — DEXAMETHASONE SODIUM PHOSPHATE 10 MG/ML IJ SOLN
INTRAMUSCULAR | Status: AC
  Filled 2017-12-18: qty 1

## 2017-12-18 MED ORDER — ROCURONIUM BROMIDE 10 MG/ML (PF) SYRINGE
PREFILLED_SYRINGE | INTRAVENOUS | Status: AC
  Filled 2017-12-18: qty 10

## 2017-12-18 MED ORDER — ACETAMINOPHEN 500 MG PO TABS
1000.0000 mg | ORAL_TABLET | Freq: Once | ORAL | Status: DC
  Filled 2017-12-18 (×2): qty 2

## 2017-12-18 MED ORDER — LACTATED RINGERS IV SOLN
INTRAVENOUS | Status: DC
  Administered 2017-12-18: 08:00:00 via INTRAVENOUS
  Filled 2017-12-18: qty 1000

## 2017-12-18 MED ORDER — ONDANSETRON HCL 4 MG/2ML IJ SOLN
INTRAMUSCULAR | Status: AC
  Filled 2017-12-18: qty 2

## 2017-12-18 MED ORDER — LACTATED RINGERS IV SOLN
INTRAVENOUS | Status: DC
  Filled 2017-12-18: qty 1000

## 2017-12-18 MED ORDER — OXYCODONE HCL 5 MG PO TABS
5.0000 mg | ORAL_TABLET | Freq: Once | ORAL | Status: DC | PRN
  Filled 2017-12-18: qty 1

## 2017-12-18 MED ORDER — ROCURONIUM BROMIDE 10 MG/ML (PF) SYRINGE
PREFILLED_SYRINGE | INTRAVENOUS | Status: DC | PRN
  Administered 2017-12-18: 50 mg via INTRAVENOUS

## 2017-12-18 MED ORDER — HYDROCODONE-ACETAMINOPHEN 5-325 MG PO TABS: 1.0000 | ORAL_TABLET | Freq: Four times a day (QID) | ORAL | 0 refills | Status: DC | PRN

## 2017-12-18 MED ORDER — SCOPOLAMINE 1 MG/3DAYS TD PT72
MEDICATED_PATCH | TRANSDERMAL | Status: DC | PRN
  Administered 2017-12-18: 1 via TRANSDERMAL

## 2017-12-18 MED ORDER — LIDOCAINE 2% (20 MG/ML) 5 ML SYRINGE
INTRAMUSCULAR | Status: DC | PRN
  Administered 2017-12-18: 60 mg via INTRAVENOUS

## 2017-12-18 MED ORDER — PROMETHAZINE HCL 25 MG/ML IJ SOLN
6.2500 mg | INTRAMUSCULAR | Status: DC | PRN
  Filled 2017-12-18: qty 1

## 2017-12-18 MED ORDER — FENTANYL CITRATE (PF) 100 MCG/2ML IJ SOLN
INTRAMUSCULAR | Status: AC
  Filled 2017-12-18: qty 2

## 2017-12-18 MED ORDER — ARTIFICIAL TEARS OPHTHALMIC OINT
TOPICAL_OINTMENT | OPHTHALMIC | Status: AC
  Filled 2017-12-18: qty 3.5

## 2017-12-18 MED ORDER — MIDAZOLAM HCL 2 MG/2ML IJ SOLN
INTRAMUSCULAR | Status: AC
  Filled 2017-12-18: qty 2

## 2017-12-18 MED ORDER — GABAPENTIN 300 MG PO CAPS
300.0000 mg | ORAL_CAPSULE | Freq: Once | ORAL | Status: AC
  Administered 2017-12-18: 300 mg via ORAL
  Filled 2017-12-18: qty 1

## 2017-12-18 MED ORDER — PROPOFOL 10 MG/ML IV BOLUS
INTRAVENOUS | Status: DC | PRN
  Administered 2017-12-18: 200 mg via INTRAVENOUS

## 2017-12-18 MED ORDER — CHLORHEXIDINE GLUCONATE 4 % EX LIQD
60.0000 mL | Freq: Once | CUTANEOUS | Status: DC
  Filled 2017-12-18: qty 118

## 2017-12-18 MED ORDER — MIDAZOLAM HCL 2 MG/2ML IJ SOLN
INTRAMUSCULAR | Status: DC | PRN
  Administered 2017-12-18: 2 mg via INTRAVENOUS

## 2017-12-18 MED ORDER — OXYCODONE HCL 5 MG/5ML PO SOLN
5.0000 mg | Freq: Once | ORAL | Status: DC | PRN
  Filled 2017-12-18: qty 5

## 2017-12-18 MED ORDER — LACTATED RINGERS IV SOLN: INTRAVENOUS | Status: DC | PRN

## 2017-12-18 MED ORDER — GABAPENTIN 300 MG PO CAPS
ORAL_CAPSULE | ORAL | Status: AC
  Filled 2017-12-18: qty 1

## 2017-12-18 MED ORDER — SCOPOLAMINE 1 MG/3DAYS TD PT72
MEDICATED_PATCH | TRANSDERMAL | Status: AC
  Filled 2017-12-18: qty 1

## 2017-12-18 MED ORDER — SODIUM CHLORIDE 0.9 % IV SOLN
INTRAVENOUS | Status: DC | PRN
  Administered 2017-12-18: 50 ug/min via INTRAVENOUS

## 2017-12-18 MED ORDER — CEFAZOLIN SODIUM-DEXTROSE 2-4 GM/100ML-% IV SOLN
2.0000 g | INTRAVENOUS | Status: DC
  Filled 2017-12-18: qty 100

## 2017-12-18 MED ORDER — SUGAMMADEX SODIUM 200 MG/2ML IV SOLN
INTRAVENOUS | Status: DC | PRN
  Administered 2017-12-18: 200 mg via INTRAVENOUS

## 2017-12-18 MED ORDER — FENTANYL CITRATE (PF) 100 MCG/2ML IJ SOLN
INTRAMUSCULAR | Status: DC | PRN
  Administered 2017-12-18: 25 ug via INTRAVENOUS
  Administered 2017-12-18: 50 ug via INTRAVENOUS
  Administered 2017-12-18: 25 ug via INTRAVENOUS

## 2017-12-18 MED ORDER — PROPOFOL 10 MG/ML IV BOLUS
INTRAVENOUS | Status: AC
  Filled 2017-12-18: qty 40

## 2017-12-18 MED ORDER — CEFAZOLIN SODIUM-DEXTROSE 2-4 GM/100ML-% IV SOLN
INTRAVENOUS | Status: AC
  Filled 2017-12-18: qty 100

## 2017-12-18 MED ORDER — FENTANYL CITRATE (PF) 100 MCG/2ML IJ SOLN
25.0000 ug | INTRAMUSCULAR | Status: DC | PRN
  Filled 2017-12-18: qty 1

## 2017-12-18 MED ORDER — PHENYLEPHRINE HCL 10 MG/ML IJ SOLN
INTRAMUSCULAR | Status: DC | PRN
  Administered 2017-12-18: 80 ug via INTRAVENOUS

## 2017-12-18 MED ORDER — ONDANSETRON HCL 4 MG/2ML IJ SOLN
INTRAMUSCULAR | Status: DC | PRN
  Administered 2017-12-18: 4 mg via INTRAVENOUS

## 2017-12-18 MED ORDER — BUPIVACAINE-EPINEPHRINE (PF) 0.5% -1:200000 IJ SOLN
INTRAMUSCULAR | Status: DC | PRN
  Administered 2017-12-18: 30 mL via PERINEURAL

## 2017-12-18 SURGICAL SUPPLY — 42 items
BNDG COHESIVE 4X5 TAN STRL (GAUZE/BANDAGES/DRESSINGS) ×2 IMPLANT
CLOSURE WOUND 1/2 X4 (GAUZE/BANDAGES/DRESSINGS) ×1
DRAPE C-ARM 42X72 X-RAY (DRAPES) ×3 IMPLANT
DRAPE INCISE IOBAN 66X45 STRL (DRAPES) ×3 IMPLANT
DRAPE U-SHAPE 47X51 STRL (DRAPES) ×2 IMPLANT
DRESSING AQUACEL AQ EXTRA 4X5 (GAUZE/BANDAGES/DRESSINGS) IMPLANT
DRSG ADAPTIC 3X8 NADH LF (GAUZE/BANDAGES/DRESSINGS) ×2 IMPLANT
DRSG AQUACEL AG ADV 3.5X 6 (GAUZE/BANDAGES/DRESSINGS) IMPLANT
DURAPREP 26ML APPLICATOR (WOUND CARE) ×3 IMPLANT
GAUZE SPONGE 4X4 12PLY STRL (GAUZE/BANDAGES/DRESSINGS) ×2 IMPLANT
GLOVE BIO SURGEON STRL SZ7.5 (GLOVE) ×6 IMPLANT
GLOVE BIO SURGEON STRL SZ8.5 (GLOVE) ×2 IMPLANT
GLOVE BIOGEL PI IND STRL 8 (GLOVE) ×2 IMPLANT
GLOVE BIOGEL PI INDICATOR 8 (GLOVE) ×4
GOWN STRL REUS W/TWL XL LVL3 (GOWN DISPOSABLE) ×6 IMPLANT
GRAFT TISS 230-320 GRACILIS (Bone Implant) IMPLANT
KIT AC JOINT DISP (KITS) ×2 IMPLANT
KIT TURNOVER KIT B (KITS) ×3 IMPLANT
PACK BASIN DAY SURGERY FS (CUSTOM PROCEDURE TRAY) ×3 IMPLANT
PACK SHOULDER (CUSTOM PROCEDURE TRAY) ×3 IMPLANT
PAD ABD 8X10 STRL (GAUZE/BANDAGES/DRESSINGS) ×2 IMPLANT
PAD ARMBOARD 7.5X6 YLW CONV (MISCELLANEOUS) ×2 IMPLANT
SLING ARM IMMOBILIZER LRG (SOFTGOODS) ×3 IMPLANT
SLING ARM MED ADULT FOAM STRAP (SOFTGOODS) ×2 IMPLANT
STRIP CLOSURE SKIN 1/2X4 (GAUZE/BANDAGES/DRESSINGS) ×2 IMPLANT
SUCTION FRAZIER HANDLE 10FR (MISCELLANEOUS) ×2
SUCTION TUBE FRAZIER 10FR DISP (MISCELLANEOUS) IMPLANT
SUT FIBERWIRE #2 38 REV NDL BL (SUTURE) ×3
SUT FIBERWIRE 2-0 18 17.9 3/8 (SUTURE) ×3
SUT MNCRL AB 3-0 PS2 27 (SUTURE) ×2 IMPLANT
SUT MNCRL AB 4-0 PS2 18 (SUTURE) ×2 IMPLANT
SUT MON AB 2-0 SH 27 (SUTURE) ×3
SUT MON AB 2-0 SH27 (SUTURE) IMPLANT
SUT VICRYL 0 27 CT2 27 ABS (SUTURE) ×5 IMPLANT
SUTURE FIBERWR 2-0 18 17.9 3/8 (SUTURE) IMPLANT
SUTURE FIBERWR#2 38 REV NDL BL (SUTURE) IMPLANT
TAPE PAPER 3X10 WHT MICROPORE (GAUZE/BANDAGES/DRESSINGS) ×2 IMPLANT
TENDON GRACILIS FROZEN (Bone Implant) ×3 IMPLANT
TENDON GRACILIS FROZEN 230-320 (Bone Implant) ×1 IMPLANT
TOWEL OR 17X24 6PK STRL BLUE (TOWEL DISPOSABLE) ×6 IMPLANT
ZIP TIGHT FIXATION SYSTEM ×1 IMPLANT
ZIPLOOP FIXATION AC JOINT DBL (Orthopedic Implant) ×2 IMPLANT

## 2017-12-18 NOTE — H&P (Signed)
ORTHOPAEDIC CONSULTATION  REQUESTING PHYSICIAN: [name], MD  Chief Complaint: L shoulder injury  HPI: Dawn Hartman is a 22 y.o. female who complains of L shoulder pain after a fall onto her L shoulder  Past Medical History:  Diagnosis Date  . Acromioclavicular joint separation 12/08/2017  . Anemia   . Asthma    Interm  . Breast mass, right 03/2012  . Dislocation of left shoulder joint    AC seperation  . Headache   . High triglycerides    takes fish oil supplement  . History of epistaxis    2-3 hours  . History of MRSA infection age 44   finger  . Ileitis 01/15/2014  . 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 Ovarian cyst   . Vasovagal episode    during nosebleed   Past Surgical History:  Procedure Laterality Date  . ADENOIDECTOMY    . BREAST LUMPECTOMY WITH NEEDLE LOCALIZATION  04/15/2012   Procedure: BREAST LUMPECTOMY WITH NEEDLE LOCALIZATION;  Surgeon: Rolm Bookbinder, MD;  Location: [facility];  Service: General;  Laterality: Right;  right breast wire localized mass excision  . MYRINGOTOMY  04/1999; 04/2002  . NASAL CAUTERIZATION  Bilateral   . TONSILLECTOMY  03/2010   Social History   Socioeconomic History  . Marital status: Single    Spouse name: Not on file  . Number of children: Not on file  . Years of education: Not on file  . Highest education level: Not on file  Occupational History  . Not on file  Social Needs  . Financial resource strain: Not on file  . Food insecurity:    Worry: Not on file    Inability: Not on file  . Transportation needs:    Medical: Not on file    Non-medical: Not on file  Tobacco Use  . Smoking status: Never Smoker  . Smokeless tobacco: Never Used  Substance and Sexual Activity  . Alcohol use: No  . Drug use: No  . Sexual activity: Never    Birth control/protection: Abstinence  Lifestyle  . Physical activity:    Days per week: Not on file    Minutes per session: Not on file  . Stress:  Not on file  Relationships  . Social connections:    Talks on phone: Not on file    Gets together: Not on file    Attends religious service: Not on file    Active member of club or organization: Not on file    Attends meetings of clubs or organizations: Not on file    Relationship status: Not on file  Other Topics Concern  . Not on file  Social History Narrative  . Not on file   Family History  Problem Relation Age of Onset  . Heart disease Paternal Grandfather        atrial fib.  . Asthma Paternal Grandfather        as a child  . Diabetes Maternal Grandmother   . Hypertension Maternal Grandmother   . Leukemia Maternal Grandfather   . Cancer Paternal Grandmother        breast CA  . Anesthesia problems Mother        post-op N/V  . Asthma Sister    Allergies  Allergen Reactions  . Adhesive [Tape]     Paper tape only   Prior to Admission medications   Medication Sig Start Date End Date Taking? Authorizing Provider  IRON, FERROUS SULFATE, PO Take  by mouth daily.   Yes [provider]  oxyCODONE-acetaminophen (PERCOCET/ROXICET) 5-325 MG tablet Take by mouth every 4 (four) hours as needed for severe pain.   Yes [provider]  HYDROcodone-acetaminophen (NORCO/VICODIN) 5-325 MG tablet Take 1 tablet by mouth every 4 (four) hours as needed for moderate pain. 12/09/17   Jola Schmidt, MD  ibuprofen (ADVIL,MOTRIN) 400 MG tablet Take 1 tablet (400 mg total) by mouth every 8 (eight) hours as needed. 12/09/17   Jola Schmidt, MD   No results found.  Positive ROS: All other systems have been reviewed and were otherwise negative with the exception of those mentioned in the HPI and as above.  Labs cbc No results for input(s): WBC, HGB, HCT, PLT in the last 72 hours.  Labs inflam No results for input(s): CRP in the last 72 hours.  Invalid input(s): ESR  Labs coag No results for input(s): INR, PTT in the last 72 hours.  Invalid input(s): PT  No results for  input(s): NA, K, CL, CO2, GLUCOSE, BUN, CREATININE, CALCIUM in the last 72 hours.  Physical Exam: There were no vitals filed for this visit. General: Alert, no acute distress Cardiovascular: No pedal edema Respiratory: No cyanosis, no use of accessory musculature GI: No organomegaly, abdomen is soft and non-tender Skin: No lesions in the area of chief complaint other than those listed below in MSK exam.  Neurologic:Sensation intact distally save for the below mentioned MSK exam Psychiatric: Patient is competent for consent with normal mood and affect Lymphatic: No axillary or cervical lymphadenopathy  MUSCULOSKELETAL:  LUE: NVI, Distal clav elevation Other extremities are atraumatic with painless ROM and NVI.  Assessment: AC separation >100%  Plan: Open CC lig recon with Grafting    [name], MD Cell [phone]   12/18/2017 7:14 AM

## 2017-12-18 NOTE — Anesthesia Procedure Notes (Signed)
Anesthesia Regional Block: Interscalene brachial plexus block   Pre-Anesthetic Checklist: ,, timeout performed, Correct Patient, Correct Site, Correct Laterality, Correct Procedure, Correct Position, site marked, Risks and benefits discussed,  Surgical consent,  Pre-op evaluation,  At surgeon's request and post-op pain management  Laterality: Left  Prep: chloraprep       Needles:  Injection technique: Single-shot  Needle Type: Echogenic Needle     Needle Length: 5cm  Needle Gauge: 21     Additional Needles:   Narrative:  Start time: 12/18/2017 8:42 AM End time: 12/18/2017 8:46 AM Injection made incrementally with aspirations every 5 mL.  Performed by: Personally  Anesthesiologist: Audry Pili, MD  Additional Notes: No pain on injection. No increased resistance to injection. Injection made in 5cc increments. Good needle visualization. Patient tolerated the procedure well.

## 2017-12-18 NOTE — Discharge Instructions (Signed)
Post Anesthesia Home Care Instructions  Activity: Get plenty of rest for the remainder of the day. A responsible individual must stay with you for 24 hours following the procedure.  For the next 24 hours, DO NOT: -Drive a car -Paediatric nurse -Drink alcoholic beverages -Take any medication unless instructed by your physician -Make any legal decisions or sign important papers.  Meals: Start with liquid foods such as gelatin or soup. Progress to regular foods as tolerated. Avoid greasy, spicy, heavy foods. If nausea and/or vomiting occur, drink only clear liquids until the nausea and/or vomiting subsides. Call your physician if vomiting continues.  Special Instructions/Symptoms: Your throat may feel dry or sore from the anesthesia or the breathing tube placed in your throat during surgery. If this causes discomfort, gargle with warm salt water. The discomfort should disappear within 24 hours.  If you had a scopolamine patch placed behind your ear for the management of post- operative nausea and/or vomiting:  1. The medication in the patch is effective for 72 hours, after which it should be removed.  Wrap patch in a tissue and discard in the trash. Wash hands thoroughly with soap and water. 2. You may remove the patch earlier than 72 hours if you experience unpleasant side effects which may include dry mouth, dizziness or visual disturbances. 3. Avoid touching the patch. Wash your hands with soap and water after contact with the patch.    Surgery for Acromioclavicular Separation, Care After Refer to this sheet in the next few weeks. These instructions provide you with information about caring for yourself after your procedure. Your health care provider may also give you more specific instructions. Your treatment has been planned according to current medical practices, but problems sometimes occur. Call your health care provider if you have any problems or questions after your  procedure. What can I expect after the procedure? After the procedure, it is common to have:  Pain.  Soreness.  Stiffness.  Tenderness.  Follow these instructions at home: If you have a sling:   Wear the sling as told by your health care provider. Remove it only as told by your health care provider.  Loosen the sling if your fingers tingle, become numb, or turn cold and blue.  Do not let your sling get wet if it is not waterproof.  Keep the sling clean. Bathing  Do not take baths, swim, or use a hot tub until your health care provider approves. Ask your health care provider if you can take showers. You may only be allowed to take sponge baths for bathing.  Keep your bandage (dressing) dry until your health care provider says it can be removed. Incision care  Follow instructions from your health care provider about how to take care of your incision. Make sure you: ? Wash your hands with soap and water before you change your dressing. If soap and water are not available, use hand sanitizer. ? Change your dressing as told by your health care provider. ? Leave stitches (sutures), skin glue, or adhesive strips in place. These skin closures may need to stay in place for 2 weeks or longer. If adhesive strip edges start to loosen and curl up, you may trim the loose edges. Do not remove adhesive strips completely unless your health care provider tells you to do that.  Check your incision area every day for signs of infection. Check for: ? More redness, swelling, or pain. ? More fluid or blood. ? Warmth. ? Pus or a  bad smell. Managing pain, stiffness, and swelling   If directed, put ice on your shoulder area. ? Put ice in a plastic bag. ? Place a towel between your skin and the bag. ? Leave the ice on for 20 minutes, 2-3 times a day.  Move your fingers often to avoid stiffness and to lessen swelling.  Raise (elevate) your shoulder above the level of your heart while you are  lying down. Driving  Do not drive for 24 hours if you received a medicine to help you relax (sedative) during your procedure.  Do not drive or operate heavy machinery while taking prescription pain medicine.  Ask your health care provider when it is safe for you to drive. Activity  Do not use your arm to support your body weight until your health care provider approves.  Do not lift or hold anything with your arm until your health care provider approves.  Return to your normal activities as told by your health care provider. Ask your health care provider what activities are safe for you.  Do exercises only as told by your health care provider. General instructions   Do not use any tobacco products, such as cigarettes, chewing tobacco, or e-cigarettes. Tobacco can delay healing. If you need help quitting, ask your health care provider.  Take over-the-counter and prescription medicines only as told by your health care provider.  If you were prescribed an antibiotic medicine, take it as told by your health care provider. Do not stop taking the antibiotic even if you start to feel better.  Keep all follow-up visits as told by your health care provider. This is important. Contact a health care provider if:  You continueto have pain and stiffnessafter 6 weeks.  You cannot do your physical therapy exercises.  You have a fever.  You have more redness, swelling, or pain around your incision.  You have more fluid or blood coming from your incision.  Your incision feels warm to the touch.  You have pus or a bad smell coming from your incision.  You have pain that gets worse or does not get better with medicine. Get help right away if:  Your arm becomes numb.  Your arm turns cold and blue. This information is not intended to replace advice given to you by your health care provider. Make sure you discuss any questions you have with your health care provider. Document Released:  11/09/2004 Document Revised: 12/14/2015 Document Reviewed: 04/24/2015 Elsevier Interactive Patient Education  2018 [place] After Surgery Ask your health care provider which exercises are safe for you. Do exercises exactly as told by your health care provider and adjust them as directed. It is normal to feel mild stretching, pulling, tightness, or discomfort as you do these exercises, but you should stop right away if you feel sudden pain or your pain gets worse. Do not begin these exercises until told by your health care provider. Stretching and range of motion exercises These exercises warm up your muscles and joints and improve the movement and flexibility of your shoulder. These exercises also help to relieve pain, numbness, and tingling. Exercise A: Pendulum  1. Stand near a wall or a surface that you can hold onto for balance. 2. Bend at the waist and let your left / right arm hang straight down. Use your other arm to keep your balance. 3. Relax your arm and shoulder muscles, and move your hips and your trunk so your left / right  arm swings freely. Your arm should swing because of the motion of your body, not because you are using your arm or shoulder muscles. 4. Keep moving so your arm swings in the following directions, as told by your health care provider: ? Side to side. ? Forward and backward. ? In clockwise and counterclockwise circles. Repeat __________ times, or for __________ seconds per direction. Complete this exercise __________ times a day. Exercise B: Flexion, standing  1. Stand and hold a broomstick, a cane, or a similar object with your hands. Place your hands a little more than shoulder-width apart on the object. Your left / right hand should be palm-up, and your other hand should be palm-down. 2. Push the stick down with your healthy arm to raise your left / right arm in front of your body, and then over your head. Use your other  hand to help move the stick. Stop when you feel a stretch in your shoulder, or when you reach the angle that is recommended by your health care provider. ? Avoid shrugging your shoulder while you raise your arm. Keep your shoulder blade tucked down toward your spine. 3. Hold for __________ seconds. 4. Slowly return to the starting position. Repeat __________ times. Complete this exercise __________ times a day. Exercise C: Flexion, seated  1. Sit in a stable chair so your left / right forearm can rest on a flat surface. Your elbow should rest at a height that keeps your upper arm next to your body. 2. Keeping your shoulder relaxed, lean forward at the waist and let your hand slide forward. Stop when you feel a stretch in your shoulder, or when you reach the angle that is recommended by your health care provider. 3. Hold for __________ seconds. 4. Slowly return to the starting position. Repeat __________ times. Complete this exercise __________ times a day. Strengthening exercises These exercises build strength and endurance in your shoulder. Endurance is the ability to use your muscles for a long time, even after they get tired. Exercise D: Shoulder abduction, isometric  1. Stand or sit about 4-6 inches (10-15 cm) away from a wall with your right/left side facing the wall. 2. Bend your left / right elbow and gently press your elbow against the wall as if you are trying to move your arm out to your side. Gradually increase the pressure until you are pressing as hard as you can without shrugging your shoulder. 3. Hold for __________ seconds. 4. Slowly release the tension and relax your muscles completely before you repeat the exercise. Repeat __________ times. Complete this exercise __________ times a day. Exercise E: Internal rotation, isometric  1. Stand or sit in a doorway, facing the door frame. 2. Bend your left / right elbow and place the palm of your hand against the door frame. Only  your palm should be touching the frame. Keep your upper arm at your side. 3. Gently press your hand against the door frame, as if you are trying to push your arm toward your abdomen. ? Avoid shrugging your shoulder while you press your hand into the door frame. Keep your shoulder blade tucked down toward the middle of your back. 4. Hold for __________ seconds. 5. Slowly release the tension, and relax your muscles completely before you repeat the exercise. Repeat __________ times. Complete this exercise __________ times a day. Exercise F: External rotation, isometric  1. Stand or sit in a doorway, facing the door frame. 2. Bend your left / right elbow  and place the back of your wrist against the door frame. Only the back of your wrist should be touching the frame. Keep your upper arm at your side. 3. Gently press your wrist against the door frame, as if you are trying to push your arm away from your abdomen. ? Avoid shrugging your shoulder while you press your wrist into the door frame. Keep your shoulder blade tucked down toward the middle of your back. 4. Hold for __________ seconds. 5. Slowly release the tension, and relax your muscles completely before you repeat the exercise. Repeat __________ times. Complete this exercise __________ times a day. Exercise G: Internal rotation  1. Sit in a stable chair without armrests, or stand. 2. Secure an exercise band at elbow height at your left / right side. 3. Place a soft object, such as a folded towel or a small pillow, between your left / right upper arm and your body to move your elbow a few inches (about 10 cm) away from your side. 4. Hold the end of the exercise band so it stretches. 5. Keeping your elbow pressed against the soft object, move your forearm in, toward your abdomen. Keep your body steady so the movement is only coming from your shoulder. 6. Hold for __________ seconds. 7. Slowly return to the starting position. Repeat __________  times. Complete this exercise __________ times a day. Exercise H: External rotation  1. Sit in a stable chair without armrests, or stand. 2. Secure an exercise band at elbow height on your left / right side. 3. Place a soft object, such as a folded towel or a small pillow, between your left / right upper arm and your body to move your elbow a few inches (about 10 cm) away from your side. 4. Hold the end of the exercise band so it stretches. 5. Keeping your elbow pressed against the soft object, move your forearm out, away from your abdomen. Keep your body steady so the movement is only coming from your shoulder. 6. Hold for __________ seconds. 7. Slowly return to the starting position. Repeat __________ times. Complete this exercise __________ times a day. This information is not intended to replace advice given to you by your health care provider. Make sure you discuss any questions you have with your health care provider. Document Released: 11/09/2004 Document Revised: 12/16/2015 Document Reviewed: 04/24/2015 Elsevier Interactive Patient Education  [employer].

## 2017-12-18 NOTE — Anesthesia Postprocedure Evaluation (Signed)
Anesthesia Post Note  Patient: Dawn Hartman  Procedure(s) Performed: RECONSTRUCTION OF CORACOCLAVICULAR LIGAMENT LEFT SHOULDER (Left Shoulder)     Patient location during evaluation: PACU Anesthesia Type: General Level of consciousness: awake and alert Pain management: pain level controlled Vital Signs Assessment: post-procedure vital signs reviewed and stable Respiratory status: spontaneous breathing, nonlabored ventilation, respiratory function stable and patient connected to nasal cannula oxygen Cardiovascular status: blood pressure returned to baseline and stable Postop Assessment: no apparent nausea or vomiting Anesthetic complications: no    Last Vitals:  Vitals:   12/18/17 1115 12/18/17 1130  BP: 130/70 129/79  Pulse: (!) 105 (!) 102  Resp: 19 18  Temp:    SpO2: 100% 98%    Last Pain:  Vitals:   12/18/17 1145  TempSrc:   PainSc: 0-No pain                 Effie Berkshire

## 2017-12-18 NOTE — Op Note (Signed)
12/18/2017  11:03 AM  PATIENT:  Dawn Hartman    PRE-OPERATIVE DIAGNOSIS:  left shoulder dislocation  POST-OPERATIVE DIAGNOSIS:  Same  PROCEDURE:  RECONSTRUCTION OF CORACOCLAVICULAR LIGAMENT LEFT SHOULDER  SURGEON:  [name], MD  ASSISTANT: none  ANESTHESIA:   gen  PREOPERATIVE INDICATIONS:  Dawn Hartman is a  22 y.o. female with a diagnosis of left shoulder dislocation who failed conservative measures and elected for surgical management.    The risks benefits and alternatives were discussed with the patient preoperatively including but not limited to the risks of infection, bleeding, nerve injury, cardiopulmonary complications, the need for revision surgery, among others, and the patient was willing to proceed.  OPERATIVE IMPLANTS: Biomet zip loop, Gracillis graft  OPERATIVE FINDINGS: unstable AC joint  BLOOD LOSS: 25  COMPLICATIONS: none  TOURNIQUET TIME: none  OPERATIVE PROCEDURE:  Patient was identified in the preoperative holding area and site was marked by me She was transported to the operating theater and placed on the table in supine position taking care to pad all bony prominences. After a preincinduction time out anesthesia was induced. The left upper extremity was prepped and draped in normal sterile fashion and a pre-incision timeout was performed. She received ancef for preoperative antibiotics.   She was placed in the beachchair position again padding all bony prominences prior to prepping and draping.  I made a longitudinal incision over her distal clavicle.  I dissected down to the clavicle and elevated periosteum off the superior aspect of the clavicle.  She had ruptured all ligaments off the inferior aspect.  The [facility] joint was significantly unstable.  I then placed a K wire through the clavicle and into the central aspect of the coracoid I confirmed with digital palpation appropriate placement anterior posterior on the coracoid and used x-ray to  confirm a centralized placement of the K wire medial lateral.  I then overdrilled with the cannulated drill.  Next I inserted the zip loop and flipped this below the coracoid I confirmed this on fluoroscopy.  I then delivered the [place] graft down into the coracoid using the initial loop of the zip loop I then passed this anterior and posterior around the clavicle.  Next I placed the superior button of the zip loop I reduce the clavicle and tension this in the place took multiple x-rays and was happy with the [facility] joint alignment.  I then secured the [place] graft over top of the clavicle with a FiberWire stitch.  I was very happy with final x-rays and all hardware placement.  I then irrigated her incision I closed her periosteum over the clavicle and closed the soft tissue in layers.  Sterile dressing was applied she was awoken and taken the PACU in stable condition  POST OPERATIVE PLAN: mobilize for dvt px, sling full time

## 2017-12-18 NOTE — Interval H&P Note (Signed)
History and Physical Interval Note:  12/18/2017 7:16 AM  Dawn Hartman  has presented today for surgery, with the diagnosis of left shoulder dislocation  The various methods of treatment have been discussed with the patient and family. After consideration of risks, benefits and other options for treatment, the patient has consented to  Procedure(s): RECONSTRUCTION OF CORACOCLAVICULAR LIGAMENT LEFT SHOULDER (Left) as a surgical intervention .  The patient's history has been reviewed, patient examined, no change in status, stable for surgery.  I have reviewed the patient's chart and labs.  Questions were answered to the patient's satisfaction.     [name]

## 2017-12-18 NOTE — Anesthesia Procedure Notes (Signed)
Procedure Name: Intubation Date/Time: 12/18/2017 9:15 AM Performed by: Wanita Chamberlain, CRNA Pre-anesthesia Checklist: Patient identified, Emergency Drugs available, Suction available, Patient being monitored and Timeout performed Patient Re-evaluated:Patient Re-evaluated prior to induction Oxygen Delivery Method: Circle system utilized Preoxygenation: Pre-oxygenation with 100% oxygen Induction Type: IV induction Ventilation: Mask ventilation without difficulty Laryngoscope Size: Mac and 3 Grade View: Grade I Tube type: Oral Tube size: 7.0 mm Number of attempts: 1 Airway Equipment and Method: Stylet

## 2017-12-18 NOTE — Transfer of Care (Signed)
Immediate Anesthesia Transfer of Care Note  Patient: Dawn Hartman  Procedure(s) Performed: RECONSTRUCTION OF CORACOCLAVICULAR LIGAMENT LEFT SHOULDER (Left Shoulder)  Patient Location: PACU  Anesthesia Type:General  Level of Consciousness: awake, alert , oriented and patient cooperative  Airway & Oxygen Therapy: Patient Spontanous Breathing and Patient connected to nasal cannula oxygen  Post-op Assessment: Report given to RN and Post -op Vital signs reviewed and stable  Post vital signs: Reviewed and stable  Last Vitals:  Vitals Value Taken Time  BP    Temp    Pulse 107 12/18/2017 10:58 AM  Resp 14 12/18/2017 10:58 AM  SpO2 99 % 12/18/2017 10:58 AM  Vitals shown include unvalidated device data.  Last Pain:  Vitals:   12/18/17 0816  TempSrc:   PainSc: 0-No pain      Patients Stated Pain Goal: 7 (25/18/98 4210)  Complications: No apparent anesthesia complications

## 2017-12-20 ENCOUNTER — Encounter (HOSPITAL_BASED_OUTPATIENT_CLINIC_OR_DEPARTMENT_OTHER): Payer: Self-pay | Admitting: Orthopedic Surgery

## 2017-12-23 ENCOUNTER — Encounter (HOSPITAL_COMMUNITY): Payer: Self-pay | Admitting: Emergency Medicine

## 2017-12-23 ENCOUNTER — Emergency Department (HOSPITAL_COMMUNITY)
Admission: EM | Admit: 2017-12-23 | Discharge: 2017-12-24 | Disposition: A | Discharge status: Home / Self Care | Payer: BLUE CROSS/BLUE SHIELD | Attending: Emergency Medicine | Admitting: Emergency Medicine

## 2017-12-23 DIAGNOSIS — R569 Unspecified convulsions: Secondary | ICD-10-CM

## 2017-12-23 DIAGNOSIS — J45909 Unspecified asthma, uncomplicated: Secondary | ICD-10-CM | POA: Diagnosis not present

## 2017-12-23 DIAGNOSIS — R55 Syncope and collapse: Secondary | ICD-10-CM | POA: Diagnosis present

## 2017-12-23 DIAGNOSIS — G40909 Epilepsy, unspecified, not intractable, without status epilepticus: Secondary | ICD-10-CM | POA: Diagnosis not present

## 2017-12-23 LAB — CBC
HEMATOCRIT: 40.2 % (ref 36.0–46.0)
Hemoglobin: 13.6 g/dL (ref 12.0–15.0)
MCH: 30.1 pg (ref 26.0–34.0)
MCHC: 33.8 g/dL (ref 30.0–36.0)
MCV: 88.9 fL (ref 78.0–100.0)
PLATELETS: 329 10*3/uL (ref 150–400)
RBC: 4.52 MIL/uL (ref 3.87–5.11)
RDW: 11.9 % (ref 11.5–15.5)
WBC: 8.8 10*3/uL (ref 4.0–10.5)

## 2017-12-23 LAB — I-STAT BETA HCG BLOOD, ED (MC, WL, AP ONLY): I-stat hCG, quantitative: <5 m[IU]/mL (ref <5)

## 2017-12-23 LAB — BASIC METABOLIC PANEL
ANION GAP: 8 (ref 5–15)
BUN: 14 mg/dL (ref 6–20)
CHLORIDE: 104 mmol/L (ref 98–111)
CO2: 24 mmol/L (ref 22–32)
CREATININE: 0.87 mg/dL (ref 0.44–1.00)
Calcium: 9.4 mg/dL (ref 8.9–10.3)
GFR calc Af Amer: >60 mL/min (ref >60)
GFR calc non Af Amer: >60 mL/min (ref >60)
GLUCOSE: 107 mg/dL — AB (ref 70–99)
POTASSIUM: 3.8 mmol/L (ref 3.5–5.1)
SODIUM: 136 mmol/L (ref 135–145)

## 2017-12-23 NOTE — ED Triage Notes (Signed)
BIB EMS from grandparents house, per family pt had syncopal episode with seizure like activity after becoming upset. NO prior hx. Pt had surgery 12/18/17, reconstruction of L coracoclavicular joint.

## 2017-12-24 ENCOUNTER — Emergency Department (HOSPITAL_COMMUNITY): Payer: BLUE CROSS/BLUE SHIELD

## 2017-12-24 LAB — URINALYSIS, ROUTINE W REFLEX MICROSCOPIC
Bilirubin Urine: NEGATIVE
Glucose, UA: NEGATIVE mg/dL
HGB URINE DIPSTICK: NEGATIVE
KETONES UR: NEGATIVE mg/dL
Leukocytes, UA: NEGATIVE
NITRITE: NEGATIVE
Protein, ur: NEGATIVE mg/dL
SPECIFIC GRAVITY, URINE: 1.024 (ref 1.005–1.030)
pH: 5 (ref 5.0–8.0)

## 2017-12-24 LAB — RAPID URINE DRUG SCREEN, HOSP PERFORMED
Amphetamines: NOT DETECTED
Barbiturates: NOT DETECTED
Benzodiazepines: NOT DETECTED
COCAINE: NOT DETECTED
Opiates: POSITIVE — AB
TETRAHYDROCANNABINOL: NOT DETECTED

## 2017-12-24 LAB — ETHANOL

## 2017-12-24 LAB — SALICYLATE LEVEL: Salicylate Lvl: <7 mg/dL (ref 2.8–30.0)

## 2017-12-24 LAB — ACETAMINOPHEN LEVEL

## 2017-12-24 NOTE — ED Notes (Signed)
PT states understanding of care given, follow up care. PT ambulated from ED to car with a steady gait.  

## 2017-12-24 NOTE — ED Notes (Signed)
Pt given water at this time 

## 2017-12-24 NOTE — ED Provider Notes (Signed)
[facility] EMERGENCY DEPARTMENT Provider Note   CSN: [id number] Arrival date & time: 12/23/17  2133     History   Chief Complaint Chief Complaint  Patient presents with  . Loss of Consciousness    HPI Dawn Hartman is a 22 y.o. female.  Patient presents to the emergency department with a chief complaint of syncope versus seizure.  She was reportedly in an argument with a family member this evening and became upset, passed out, and had witnessed seizure-like activity.  She has no prior history of seizures.  She was reportedly confused for about 5 minutes after awakening.  She did not have any vomiting.  She complains of some central neck pain.  She denies any chest pain or shortness of breath.  Denies any other associated symptoms at this time.  She does have some left arm numbness, which has been present since having left shoulder surgery on 8/27.  The history is provided by the patient. No language interpreter was used.    Past Medical History:  Diagnosis Date  . Acromioclavicular joint separation 12/08/2017  . Anemia   . Asthma    Interm  . Breast mass, right 03/2012  . Dislocation of left shoulder joint    AC seperation  . Headache   . High triglycerides    takes fish oil supplement  . History of epistaxis    2-3 hours  . History of MRSA infection age 40   finger  . Ileitis 01/15/2014  . 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 Ovarian cyst   . Vasovagal episode    during nosebleed    There are no active problems to display for this patient.   Past Surgical History:  Procedure Laterality Date  . ADENOIDECTOMY    . BREAST LUMPECTOMY WITH NEEDLE LOCALIZATION  04/15/2012   Procedure: BREAST LUMPECTOMY WITH NEEDLE LOCALIZATION;  Surgeon: Rolm Bookbinder, MD;  Location: [place];  Service: General;  Laterality: Right;  right breast wire localized mass excision  . BREAST SURGERY    . MYRINGOTOMY  04/1999; 04/2002  . NASAL  CAUTERIZATION  Bilateral   . RECONSTRUCTION OF CORACOCLAVICULAR LIGAMENT Left 12/18/2017   Procedure: RECONSTRUCTION OF CORACOCLAVICULAR LIGAMENT LEFT SHOULDER;  Surgeon: [name], MD;  Location: [place];  Service: Orthopedics;  Laterality: Left;  . TONSILLECTOMY  03/2010     OB History   None      Home Medications    Prior to Admission medications   Medication Sig Start Date End Date Taking? Authorizing Provider  HYDROcodone-acetaminophen (NORCO) 5-325 MG tablet Take 1-2 tablets by mouth every 6 (six) hours as needed. 12/18/17   [name], MD  ibuprofen (ADVIL,MOTRIN) 400 MG tablet Take 1 tablet (400 mg total) by mouth every 8 (eight) hours as needed. 12/09/17   Jola Schmidt, MD  IRON, FERROUS SULFATE, PO Take by mouth daily.    [provider]  ondansetron (ZOFRAN) 4 MG tablet Take 1 tablet (4 mg total) by mouth every 8 (eight) hours as needed for nausea. 12/18/17   [name], MD    Family History Family History  Problem Relation Age of Onset  . Heart disease Paternal Grandfather        atrial fib.  . Asthma Paternal Grandfather        as a child  . Diabetes Maternal Grandmother   . Hypertension Maternal Grandmother   . Leukemia Maternal Grandfather   . Cancer Paternal Grandmother  breast CA  . Anesthesia problems Mother        post-op N/V  . Asthma Sister     Social History Social History   Tobacco Use  . Smoking status: Never Smoker  . Smokeless tobacco: Never Used  Substance Use Topics  . Alcohol use: No  . Drug use: No     Allergies   Adhesive [tape]   Review of Systems Review of Systems  All other systems reviewed and are negative.    Physical Exam Updated Vital Signs BP (!) 119/50   Pulse 86   Temp 99.4 F (37.4 C) (Oral)   Resp (!) 7   Ht 5\' 6"  (1.676 m)   Wt 83.4 kg   LMP 12/03/2017 Comment: pt shielded  SpO2 99%   BMI 29.67 kg/m   Physical Exam  Constitutional: She is  oriented to person, place, and time. She appears well-developed and well-nourished.  HENT:  Head: Normocephalic and atraumatic.  Eyes: Pupils are equal, round, and reactive to light. Conjunctivae and EOM are normal.  Neck: Normal range of motion. Neck supple.  Cardiovascular: Normal rate and regular rhythm. Exam reveals no gallop and no friction rub.  No murmur heard. Pulmonary/Chest: Effort normal and breath sounds normal. No respiratory distress. She has no wheezes. She has no rales. She exhibits no tenderness.  Abdominal: Soft. Bowel sounds are normal. She exhibits no distension and no mass. There is no tenderness. There is no rebound and no guarding.  Musculoskeletal: Normal range of motion. She exhibits no edema or tenderness.  Neurological: She is alert and oriented to person, place, and time.  Skin: Skin is warm and dry.  Psychiatric: She has a normal mood and affect. Her behavior is normal. Judgment and thought content normal.  Nursing note and vitals reviewed.    ED Treatments / Results  Labs (all labs ordered are listed, but only abnormal results are displayed) Labs Reviewed  BASIC METABOLIC PANEL - Abnormal; Notable for the following components:      Result Value   Glucose, Bld 107 (*)    All other components within normal limits  ACETAMINOPHEN LEVEL - Abnormal; Notable for the following components:   Acetaminophen (Tylenol), Serum <10 (*)    All other components within normal limits  RAPID URINE DRUG SCREEN, HOSP PERFORMED - Abnormal; Notable for the following components:   Opiates POSITIVE (*)    All other components within normal limits  CBC  URINALYSIS, ROUTINE W REFLEX MICROSCOPIC  ETHANOL  SALICYLATE LEVEL  I-STAT BETA HCG BLOOD, ED ([facility], [facility], [facility] ONLY)    EKG None ED ECG REPORT  I personally interpreted this EKG   Date: 12/24/2017   Rate: 104  Rhythm: sinus tachycardia  QRS Axis: normal  Intervals: normal  ST/T Wave abnormalities: normal  Conduction  Disutrbances:none  Narrative Interpretation:   Old EKG Reviewed: none available   Radiology Dg Cervical Spine Complete  Result Date: 12/24/2017 CLINICAL DATA:  Patient fell yesterday with neck pain. History of clavicle surgery. EXAM: CERVICAL SPINE - COMPLETE 4+ VIEW COMPARISON:  04/13/2017 FINDINGS: Six views of the cervical spine. Maintained cervical lordosis without significant disc flattening. No vertebral fracture or listhesis. No suspicious osseous lesions. There is no prevertebral soft tissue swelling. The atlantodental is normal. No splaying of the lateral masses of C1 on C2. Intact odontoid process. IMPRESSION: Negative cervical spine radiographs. Electronically Signed   By: Ashley Royalty M.D.   On: 12/24/2017 03:23   Ct Head Wo Contrast  Result Date: 12/24/2017 CLINICAL DATA:  Pain after fall yesterday after syncopal episode and seizure-like activity. EXAM: CT HEAD WITHOUT CONTRAST TECHNIQUE: Contiguous axial images were obtained from the base of the skull through the vertex without intravenous contrast. COMPARISON:  None. FINDINGS: BRAIN: The ventricles and sulci are normal. No intraparenchymal hemorrhage, mass effect nor midline shift. No acute large vascular territory infarcts. Grey-white matter distinction is maintained. The basal ganglia are unremarkable. No abnormal extra-axial fluid collections. Basal cisterns are not effaced and midline. The brainstem and cerebellar hemispheres are without acute abnormalities. VASCULAR: Unremarkable. SKULL/SOFT TISSUES: No skull fracture. No significant soft tissue swelling. ORBITS/SINUSES: The included ocular globes and orbital contents are normal.The mastoid air cells are clear. The included paranasal sinuses are well-aerated. OTHER: None. IMPRESSION: Normal head CT without acute intracranial abnormality. No skull fracture. Electronically Signed   By: Ashley Royalty M.D.   On: 12/24/2017 03:24    Procedures Procedures (including critical care  time)  Medications Ordered in ED Medications - No data to display   Initial Impression / Assessment and Plan / ED Course  I have reviewed the triage vital signs and the nursing notes.  Pertinent labs & imaging results that were available during my care of the patient were reviewed by me and considered in my medical decision making (see chart for details).     Patient with seizure that occurred tonight.  This was a first-time seizure.  She has no history of the same.  She did have generalized full body tonic-clonic shaking.  She was apparently postictal for some time afterward.  She is back to her normal baseline mental status now.  She was involved in a very stressful argument immediately prior.  Question psychogenic origin.  Laboratory work-up and CT are reassuring today.  Will recommend close follow-up with neurology.  I have reviewed driving restrictions with patient and family.  Final Clinical Impressions(s) / ED Diagnoses   Final diagnoses:  Seizure [facility])    ED Discharge Orders    None       Montine Circle, PA-C 12/24/17 6440    Orpah Greek, MD 12/24/17 [phone]

## 2017-12-24 NOTE — Discharge Instructions (Signed)
YOU MAY NOT DRIVE, SWIM, or perform any other activity in which you could harm yourself or others if you were to have another seizure.  You will need be seen and cleared by a neurologist prior to resuming these activities.  A neurologist clinic should call you.  If you do not hear from them in the next few days, please call them.  Their number has been provided.

## 2017-12-31 ENCOUNTER — Encounter: Payer: Self-pay | Admitting: Neurology

## 2017-12-31 ENCOUNTER — Ambulatory Visit: Payer: BLUE CROSS/BLUE SHIELD | Admitting: Neurology

## 2017-12-31 VITALS — BP 111/73 | HR 83 | Ht 67.0 in | Wt 185.0 lb

## 2017-12-31 DIAGNOSIS — R404 Transient alteration of awareness: Secondary | ICD-10-CM

## 2017-12-31 DIAGNOSIS — R251 Tremor, unspecified: Secondary | ICD-10-CM

## 2017-12-31 NOTE — Patient Instructions (Addendum)
I am not sure if you had a single seizure event or loss of consciousness from a syncope (passing out).   Your exam is normal, which is reassuring.   Vasovagal syncope is one of the most common causes of fainting. Vasovagal syncope occurs when your body overreacts to certain triggers, such as the sight of blood or extreme emotional distress or overheating.  The vasovagal syncope trigger causes a sudden drop in your heart rate and blood pressure, which leads to reduced blood flow to your brain, which results in a brief loss of consciousness. Vasovagal syncope is usually harmless and requires no treatment. However, if you collapse and fall, it is possible you may injure yourself.  Pre-sycopal symptoms include (but are not limited to): Skin paleness, lightheadedness, tunnel vision, nausea, a rising feeling of warmth, feeling cold and clammy, excess yawning, and blurry vision. You may have jerky, abnormal movements, a slow and weak pulse and dilated pupils and you may have some confusion and mental slowing when you come to.  Common triggers for vasovagal syncope include (but are not limited to): Standing for long periods of time, heat exposure, the sight of blood or having blood drawn, fear and straining, such as during a bowel movement.  There is no specific medication for treatment of fainting. Sometimes we use medications to keep the blood pressure elevated but this is rare. Supportive treatment includes foot exercises, wearing compression stockings or tensing your leg muscles when standing, increasing salt in your diet (unless you have high blood pressure). Avoid prolonged standing - especially in hot, crowded places - and drink plenty of fluids and change position from sitting to standing or lying to standing slowly and avoid overheating.   As discussed, for your own safety and of others: don't drive for 6 months, as long as you are free of any additional or similar episodes of loss of consciousness.  I  have ordered an EEG (brainwave test), which we will schedule. We will call you with the results.  We will do a brain scan, called MRI and call you with the test results. We will have to schedule you for this on a separate date. This test requires authorization from your insurance, and we will take care of the insurance process.  You have an appointment with orthopedics today, please clarify if you can have a brain MRI.

## 2017-12-31 NOTE — Progress Notes (Signed)
Subjective:    Patient ID: Dawn Hartman is a 22 y.o. female.  HPI     Star Age, MD, PhD [facility] [address] [place], [place] [address]  I saw patient, Dawn Hartman, is a referral from the emergency room for an episode of loss of consciousness. The patient is accompanied by her mother today. She is a 33 year old right-handed woman with an underlying medical history of asthma, anemia, left shoulder dislocation, headaches, hypertriglyceridemia, history of vasovagal syncope in the context of epistaxis in the recent past, and overweight state, who was seen in the emergency room on 12/24/2017 for an episode of loss of consciousness which occurred at home the same day, preceded by an argument. She had generalized body jerking movements as witnessed by her 88 year old sister. She had no full loss of bladder control but reported some bladder incontinence to EMS. 911 was called and she was transported to the hospital. She fell outside on grass. By the time her mother came out of the house she was not shaking.  In the emergency room, she had a normal neurological exam. She had a head CT without contrast on 12/24/2017 and I reviewed the results: IMPRESSION: Normal head CT without acute intracranial abnormality. No skull fracture.   She had an x-ray of her cervical spine on 12/24/2017 which I reviewed: IMPRESSION: Negative cervical spine radiographs.   UDS was positive for opiates. She has been on narcotic pain medication for L shoulder pain, last ordered by Dr. Percell Miller in late August 2019.  Her left shoulder is in a sling. She was told not to drive for 6 months as part as the D/C instructions from the ER.   She is a nonsmoker and does not utilize alcohol and does not drink caffeine on a regular basis. She no longer takes the hydrocodone. She has plate and screws in place from her left shoulder surgery about 2 weeks ago. She has a follow-up with her  orthopedic surgeon today.  She has no family history of epilepsy, no prior history of seizures or febrile seizures as a infant or toddler. She currently feels at baseline with the exception of left shoulder discomfort. She has not started physical therapy yet.  Her Past Medical History Is Significant For: Past Medical History:  Diagnosis Date  . Acromioclavicular joint separation 12/08/2017  . Anemia   . Asthma    Interm  . Breast mass, right 03/2012  . Dislocation of left shoulder joint    AC seperation  . Headache   . High triglycerides    takes fish oil supplement  . History of epistaxis    2-3 hours  . History of MRSA infection age 48   finger  . Ileitis 01/15/2014  . 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 Ovarian cyst   . Vasovagal episode    during nosebleed    Her Past Surgical History Is Significant For: Past Surgical History:  Procedure Laterality Date  . ADENOIDECTOMY    . BREAST LUMPECTOMY WITH NEEDLE LOCALIZATION  04/15/2012   Procedure: BREAST LUMPECTOMY WITH NEEDLE LOCALIZATION;  Surgeon: Rolm Bookbinder, MD;  Location: [place];  Service: General;  Laterality: Right;  right breast wire localized mass excision  . BREAST SURGERY    . MYRINGOTOMY  04/1999; 04/2002  . NASAL CAUTERIZATION  Bilateral   . RECONSTRUCTION OF CORACOCLAVICULAR LIGAMENT Left 12/18/2017   Procedure: RECONSTRUCTION OF CORACOCLAVICULAR LIGAMENT LEFT SHOULDER;  Surgeon: [name], MD;  Location:  [place];  Service: Orthopedics;  Laterality: Left;  . TONSILLECTOMY  03/2010    Her Family History Is Significant For: Family History  Problem Relation Age of Onset  . Heart disease Paternal Grandfather        atrial fib.  . Asthma Paternal Grandfather        as a child  . Diabetes Maternal Grandmother   . Hypertension Maternal Grandmother   . Leukemia Maternal Grandfather   . Cancer Paternal Grandmother        breast CA  . Anesthesia problems Mother         post-op N/V  . Asthma Sister     Her Social History Is Significant For: Social History   Socioeconomic History  . Marital status: Single    Spouse name: Not on file  . Number of children: Not on file  . Years of education: Not on file  . Highest education level: Not on file  Occupational History  . Not on file  Social Needs  . Financial resource strain: Not on file  . Food insecurity:    Worry: Not on file    Inability: Not on file  . Transportation needs:    Medical: Not on file    Non-medical: Not on file  Tobacco Use  . Smoking status: Never Smoker  . Smokeless tobacco: Never Used  Substance and Sexual Activity  . Alcohol use: No  . Drug use: No  . Sexual activity: Never    Birth control/protection: Abstinence  Lifestyle  . Physical activity:    Days per week: Not on file    Minutes per session: Not on file  . Stress: Not on file  Relationships  . Social connections:    Talks on phone: Not on file    Gets together: Not on file    Attends religious service: Not on file    Active member of club or organization: Not on file    Attends meetings of clubs or organizations: Not on file    Relationship status: Not on file  Other Topics Concern  . Not on file  Social History Narrative  . Not on file    Her Allergies Are:  Allergies  Allergen Reactions  . Adhesive [Tape]     Paper tape only  :   Her Current Medications Are:  Outpatient Encounter Medications as of 12/31/2017  Medication Sig  . albuterol (PROVENTIL HFA;VENTOLIN HFA) 108 (90 Base) MCG/ACT inhaler Inhale 1-2 puffs into the lungs every 6 (six) hours as needed for wheezing or shortness of breath.  Marland Kitchen ibuprofen (ADVIL,MOTRIN) 400 MG tablet Take 1 tablet (400 mg total) by mouth every 8 (eight) hours as needed. (Patient taking differently: Take 400 mg by mouth every 8 (eight) hours as needed for moderate pain. )  . IRON, FERROUS SULFATE, PO Take 1 tablet by mouth daily.   . [DISCONTINUED]  HYDROcodone-acetaminophen (NORCO) 5-325 MG tablet Take 1-2 tablets by mouth every 6 (six) hours as needed. (Patient taking differently: Take 1-2 tablets by mouth every 6 (six) hours as needed for moderate pain. )  . [DISCONTINUED] ondansetron (ZOFRAN) 4 MG tablet Take 1 tablet (4 mg total) by mouth every 8 (eight) hours as needed for nausea.   No facility-administered encounter medications on file as of 12/31/2017.   : Review of Systems:  Out of a complete 14 point review of systems, all are reviewed and negative with the exception of these symptoms as listed below:  Review  of Systems  Neurological:       Pt presents today to discuss her episodes of losing consciousness. Pt is unsure of this is a seizure. Pt has a history of losing consciousness.    Objective:  Neurological Exam  Physical Exam Physical Examination:   Vitals:   12/31/17 1012  BP: 111/73  Pulse: 83    On orthostatic testing, she has a line blood pressure 112/67 with a pulse of 80, sitting 111/73 with a pulse of 76, standing 110/72 with pulse of 83. She has no orthostatic lightheadedness or vertiginous symptoms with changes in position.  General Examination: The patient is a very pleasant 22 y.o. female in no acute distress. She appears well-developed and well-nourished and well groomed.   HEENT: Normocephalic, atraumatic, pupils are equal, round and reactive to light and accommodation. Funduscopic exam is normal with sharp disc margins noted. Extraocular tracking is good without limitation to gaze excursion or nystagmus noted. Normal smooth pursuit is noted. Hearing is grossly intact. Tympanic membranes are clear bilaterally. Face is symmetric with normal facial animation and normal facial sensation. Speech is clear with no dysarthria noted. There is no hypophonia. There is no lip, neck/head, jaw or voice tremor. Neck is supple with full range of passive and active motion. There are no carotid bruits on auscultation.  Oropharynx exam reveals: mild mouth dryness, adequate dental hygiene and braces in place. Tongue protrudes centrally and palate elevates symmetrically.   Chest: Clear to auscultation without wheezing, rhonchi or crackles noted.  Heart: S1+S2+0, regular and normal without murmurs, rubs or gallops noted.   Abdomen: Soft, non-tender and non-distended with normal bowel sounds appreciated on auscultation.  Extremities: There is no pitting edema in the distal lower extremities bilaterally.  Skin: Warm and dry without trophic changes noted.  Musculoskeletal: exam reveals no obvious joint deformities, tenderness or joint swelling or erythema. L shoulder/arm in sling, which I did not remove.   Neurologically:  Mental status: The patient is awake, alert and oriented in all 4 spheres. Her immediate and remote memory, attention, language skills and fund of knowledge are appropriate. There is no evidence of aphasia, agnosia, apraxia or anomia. Speech is clear with normal prosody and enunciation. Thought process is linear. Mood is normal and affect is normal.  Cranial nerves II - XII are as described above under HEENT exam. In addition: shoulder shrug is normal with equal shoulder height noted. Motor exam: Normal bulk, strength and tone is noted. There is no drift, tremor or rebound. Romberg is negative. Reflexes are 2+ throughout. Babinski: Toes are flexor bilaterally. Fine motor skills and coordination: intact with normal finger taps, normal hand movements, normal rapid alternating patting, normal foot taps and normal foot agility.  Cerebellar testing: No dysmetria or intention tremor on finger to nose testing. Heel to shin is unremarkable bilaterally. There is no truncal or gait ataxia.  Sensory exam: intact to light touch, pinprick, vibration, temperature sense in the upper and lower extremities.  Gait, station and balance: She stands easily. No veering to one side is noted. No leaning to one side is  noted. Posture is age-appropriate and stance is narrow based. Gait shows normal stride length and normal pace. No problems turning are noted. Tandem walk is unremarkable.  Assessment and plan:  In summary, Shameika Speelman Hartsock is a very pleasant 22 y.o.-year old female with an underlying medical history of asthma, anemia, left shoulder dislocation, headaches, hypertriglyceridemia, history of vasovagal syncope in the context of epistaxis in the recent  past, and overweight state, who presents for evaluation of a spell of unconsciousness associated with generalized twitching as witnessed by her sister, overall less than 5 minutes in duration. Differential diagnosis includes isolated seizure event versus convulsive syncope. Patient is advised of this and I had a discussion with the patient and her mom about possible causes. She has a normal exam which is reassuring. Nevertheless, she is advised about the general recommendations and workup for his these types of spells. She is advised to proceed with a brain MRI with and without contrast to rule out a structural cause of her symptoms. She may or may not be able to get an MRI done because of her recent left shoulder surgery and hardware in place. She has an appointment with her orthopedic surgeon today and is encouraged to bring this up and call us back. I did make a note in her MRI request about this. She is advised to undergo an EEG and we will call her with her test results.  She is advised not to drive for 6 months so long as she is free of any additional or similar spells. I suggested a follow-up in 6 months routinely, sooner if needed. We will keep her posted as to her test results in the interim. I answered all their questions today and the patient and her mother were in agreement.   Star Age, MD, PhD

## 2018-01-01 ENCOUNTER — Telehealth: Payer: Self-pay | Admitting: Neurology

## 2018-01-01 NOTE — Telephone Encounter (Signed)
lvm for pt to call back about scheduling mri  [employer] Auth: [employer] via [employer]

## 2018-01-03 ENCOUNTER — Ambulatory Visit: Payer: BLUE CROSS/BLUE SHIELD | Admitting: Diagnostic Neuroimaging

## 2018-01-03 DIAGNOSIS — R251 Tremor, unspecified: Secondary | ICD-10-CM | POA: Diagnosis not present

## 2018-01-03 DIAGNOSIS — R404 Transient alteration of awareness: Secondary | ICD-10-CM

## 2018-01-03 NOTE — Telephone Encounter (Signed)
Just an FYI  Patient mom came by stating she just had surgery and they need to wait a little bit before having the MRI.

## 2018-01-07 ENCOUNTER — Telehealth: Payer: Self-pay

## 2018-01-07 NOTE — Telephone Encounter (Signed)
I called pt to discuss. No answer, VM is full. Will try again later. °

## 2018-01-07 NOTE — Telephone Encounter (Signed)
-----   Message from Star Age, MD sent at 01/07/2018 10:21 AM EDT ----- Please call pt re: EEG.  Please call and advise the patient that the EEG or brain wave test we performed was reported as normal in the awake and drowsy states. We checked for abnormal electrical discharges in the brain waves and the report suggested normal findings. No further action is required on this test at this time. Please remind patient to keep any upcoming appointments or tests and to call us with any interim questions, concerns, problems or updates. Thanks,  Star Age, MD, PhD

## 2018-01-07 NOTE — Progress Notes (Signed)
Please call pt re: EEG.  Please call and advise the patient that the EEG or brain wave test we performed was reported as normal in the awake and drowsy states. We checked for abnormal electrical discharges in the brain waves and the report suggested normal findings. No further action is required on this test at this time. Please remind patient to keep any upcoming appointments or tests and to call us with any interim questions, concerns, problems or updates. Thanks,  Star Age, MD, PhD

## 2018-01-07 NOTE — Procedures (Signed)
   [facility]  EEG (ELECTROENCEPHALOGRAM) REPORT   STUDY DATE: 01/03/18 PATIENT NAME: Dawn Hartman DOB: 02-Nov-1995 MRN: [id number]  ORDERING CLINICIAN: Star Age, MD PhD   TECHNOLOGIST: Arelia Longest  TECHNIQUE: Electroencephalogram was recorded utilizing standard 10-20 system of lead placement and reformatted into average and bipolar montages.  RECORDING TIME: 20 minute 32 seconds ACTIVATION: hyperventilation and photic stimulation  CLINICAL INFORMATION: 22 year old female with seizure vs syncope.  FINDINGS: Posterior dominant background rhythms, which attenuate with eye opening, ranging 11-12 hertz and 30-35 microvolts. No focal, lateralizing, epileptiform activity or seizures are seen. Patient recorded in the awake and drowsy state. EKG channel shows regular rhythm of 75-80 beats per minute.   IMPRESSION:   Normal EEG in the awake and drowsy states.    INTERPRETING PHYSICIAN:  Penni Bombard, MD Certified in Neurology, Neurophysiology and Neuroimaging  [facility] [address] [place], [place] [address] [phone]

## 2018-01-08 NOTE — Telephone Encounter (Signed)
I called pt again to discuss her EEG results. No answer, VM full, will try again later.

## 2018-01-09 NOTE — Telephone Encounter (Signed)
I called pt and advised her of the normal EEG results. Pt reports that they have not scheduled the MRI since she just had shoulder surgery. Pt will follow up as scheduled and let us know of any changes or questions. Pt verbalized understanding of results. Pt had no questions at this time but was encouraged to call back if questions arise.

## 2018-01-21 ENCOUNTER — Other Ambulatory Visit: Payer: Self-pay

## 2018-01-21 ENCOUNTER — Encounter (HOSPITAL_BASED_OUTPATIENT_CLINIC_OR_DEPARTMENT_OTHER): Payer: Self-pay | Admitting: *Deleted

## 2018-01-21 NOTE — Progress Notes (Signed)
Patient to arrive at 1015. NPO after MN. No medications AM of surgery.

## 2018-01-22 ENCOUNTER — Ambulatory Visit (HOSPITAL_BASED_OUTPATIENT_CLINIC_OR_DEPARTMENT_OTHER): Payer: BLUE CROSS/BLUE SHIELD | Admitting: Certified Registered"

## 2018-01-22 ENCOUNTER — Ambulatory Visit (HOSPITAL_BASED_OUTPATIENT_CLINIC_OR_DEPARTMENT_OTHER)
Admission: RE | Admit: 2018-01-22 | Discharge: 2018-01-22 | Disposition: A | Discharge status: Home / Self Care | Payer: BLUE CROSS/BLUE SHIELD | Source: Ambulatory Visit | Attending: Orthopedic Surgery | Admitting: Orthopedic Surgery

## 2018-01-22 ENCOUNTER — Encounter (HOSPITAL_BASED_OUTPATIENT_CLINIC_OR_DEPARTMENT_OTHER): Payer: Self-pay | Admitting: Emergency Medicine

## 2018-01-22 ENCOUNTER — Other Ambulatory Visit: Payer: Self-pay

## 2018-01-22 ENCOUNTER — Encounter (HOSPITAL_BASED_OUTPATIENT_CLINIC_OR_DEPARTMENT_OTHER)
Admission: RE | Disposition: A | Discharge status: Home / Self Care | Payer: Self-pay | Source: Ambulatory Visit | Attending: Orthopedic Surgery

## 2018-01-22 DIAGNOSIS — Z888 Allergy status to other drugs, medicaments and biological substances status: Secondary | ICD-10-CM | POA: Insufficient documentation

## 2018-01-22 DIAGNOSIS — Z79899 Other long term (current) drug therapy: Secondary | ICD-10-CM | POA: Insufficient documentation

## 2018-01-22 DIAGNOSIS — Z8614 Personal history of Methicillin resistant Staphylococcus aureus infection: Secondary | ICD-10-CM | POA: Diagnosis not present

## 2018-01-22 DIAGNOSIS — L089 Local infection of the skin and subcutaneous tissue, unspecified: Secondary | ICD-10-CM

## 2018-01-22 DIAGNOSIS — L02414 Cutaneous abscess of left upper limb: Secondary | ICD-10-CM | POA: Insufficient documentation

## 2018-01-22 DIAGNOSIS — J45909 Unspecified asthma, uncomplicated: Secondary | ICD-10-CM | POA: Diagnosis not present

## 2018-01-22 DIAGNOSIS — S40222A Blister (nonthermal) of left shoulder, initial encounter: Secondary | ICD-10-CM

## 2018-01-22 HISTORY — DX: Adverse effect of unspecified anesthetic, initial encounter: T41.45XA

## 2018-01-22 HISTORY — PX: INCISION AND DRAINAGE: SHX5863

## 2018-01-22 HISTORY — DX: Other specified postprocedural states: Z98.890

## 2018-01-22 HISTORY — DX: Family history of other specified conditions: Z84.89

## 2018-01-22 HISTORY — DX: Other specified postprocedural states: R11.2

## 2018-01-22 HISTORY — DX: Other complications of anesthesia, initial encounter: T88.59XA

## 2018-01-22 LAB — POCT PREGNANCY, URINE: PREG TEST UR: NEGATIVE

## 2018-01-22 SURGERY — INCISION AND DRAINAGE
Anesthesia: General | Site: Shoulder | Laterality: Left

## 2018-01-22 MED ORDER — ONDANSETRON HCL 4 MG/2ML IJ SOLN
INTRAMUSCULAR | Status: DC | PRN
  Administered 2018-01-22: 4 mg via INTRAVENOUS

## 2018-01-22 MED ORDER — SCOPOLAMINE 1 MG/3DAYS TD PT72
1.0000 | MEDICATED_PATCH | Freq: Once | TRANSDERMAL | Status: AC
  Administered 2018-01-22: 1 via TRANSDERMAL
  Administered 2018-01-22: 1.5 mg via TRANSDERMAL
  Filled 2018-01-22: qty 1

## 2018-01-22 MED ORDER — DEXAMETHASONE SODIUM PHOSPHATE 10 MG/ML IJ SOLN
INTRAMUSCULAR | Status: DC | PRN
  Administered 2018-01-22: 10 mg via INTRAVENOUS

## 2018-01-22 MED ORDER — CEPHALEXIN 500 MG PO CAPS: 500.0000 mg | ORAL_CAPSULE | Freq: Four times a day (QID) | ORAL | 0 refills | Status: AC

## 2018-01-22 MED ORDER — CEFAZOLIN SODIUM-DEXTROSE 2-4 GM/100ML-% IV SOLN
INTRAVENOUS | Status: AC
  Filled 2018-01-22: qty 100

## 2018-01-22 MED ORDER — CEFAZOLIN SODIUM-DEXTROSE 2-4 GM/100ML-% IV SOLN
2.0000 g | INTRAVENOUS | Status: AC
  Administered 2018-01-22: 2 g via INTRAVENOUS
  Filled 2018-01-22: qty 100

## 2018-01-22 MED ORDER — SUCCINYLCHOLINE CHLORIDE 200 MG/10ML IV SOSY
PREFILLED_SYRINGE | INTRAVENOUS | Status: DC | PRN
  Administered 2018-01-22: 100 mg via INTRAVENOUS

## 2018-01-22 MED ORDER — FENTANYL CITRATE (PF) 100 MCG/2ML IJ SOLN
INTRAMUSCULAR | Status: DC | PRN
  Administered 2018-01-22 (×2): 50 ug via INTRAVENOUS

## 2018-01-22 MED ORDER — GABAPENTIN 300 MG PO CAPS
ORAL_CAPSULE | ORAL | Status: AC
  Filled 2018-01-22: qty 1

## 2018-01-22 MED ORDER — ACETAMINOPHEN 500 MG PO TABS
ORAL_TABLET | ORAL | Status: AC
  Filled 2018-01-22: qty 2

## 2018-01-22 MED ORDER — PROPOFOL 10 MG/ML IV BOLUS
INTRAVENOUS | Status: AC
  Filled 2018-01-22: qty 20

## 2018-01-22 MED ORDER — FENTANYL CITRATE (PF) 100 MCG/2ML IJ SOLN
50.0000 ug | Freq: Once | INTRAMUSCULAR | Status: AC
  Administered 2018-01-22: 50 ug via INTRAVENOUS
  Filled 2018-01-22: qty 1

## 2018-01-22 MED ORDER — LIDOCAINE 2% (20 MG/ML) 5 ML SYRINGE
INTRAMUSCULAR | Status: DC | PRN
  Administered 2018-01-22: 60 mg via INTRAVENOUS

## 2018-01-22 MED ORDER — MIDAZOLAM HCL 2 MG/2ML IJ SOLN
INTRAMUSCULAR | Status: DC | PRN
  Administered 2018-01-22: 2 mg via INTRAVENOUS

## 2018-01-22 MED ORDER — MIDAZOLAM HCL 2 MG/2ML IJ SOLN
INTRAMUSCULAR | Status: AC
  Filled 2018-01-22: qty 2

## 2018-01-22 MED ORDER — CHLORHEXIDINE GLUCONATE 4 % EX LIQD
60.0000 mL | Freq: Once | CUTANEOUS | Status: DC
  Filled 2018-01-22: qty 118

## 2018-01-22 MED ORDER — DOXYCYCLINE HYCLATE 50 MG PO CAPS: 100.0000 mg | ORAL_CAPSULE | Freq: Two times a day (BID) | ORAL | 0 refills | Status: AC

## 2018-01-22 MED ORDER — LACTATED RINGERS IV SOLN
INTRAVENOUS | Status: DC
  Filled 2018-01-22: qty 1000

## 2018-01-22 MED ORDER — ONDANSETRON HCL 4 MG PO TABS: 4.0000 mg | ORAL_TABLET | Freq: Three times a day (TID) | ORAL | 0 refills | Status: DC | PRN

## 2018-01-22 MED ORDER — MIDAZOLAM HCL 2 MG/2ML IJ SOLN
2.0000 mg | Freq: Once | INTRAMUSCULAR | Status: AC
  Administered 2018-01-22: 2 mg via INTRAVENOUS
  Filled 2018-01-22: qty 2

## 2018-01-22 MED ORDER — GABAPENTIN 300 MG PO CAPS
300.0000 mg | ORAL_CAPSULE | Freq: Once | ORAL | Status: AC
  Administered 2018-01-22: 300 mg via ORAL
  Filled 2018-01-22: qty 1

## 2018-01-22 MED ORDER — ARTIFICIAL TEARS OPHTHALMIC OINT
TOPICAL_OINTMENT | OPHTHALMIC | Status: AC
  Filled 2018-01-22: qty 3.5

## 2018-01-22 MED ORDER — SUCCINYLCHOLINE CHLORIDE 200 MG/10ML IV SOSY
PREFILLED_SYRINGE | INTRAVENOUS | Status: AC
  Filled 2018-01-22: qty 10

## 2018-01-22 MED ORDER — ONDANSETRON HCL 4 MG/2ML IJ SOLN
INTRAMUSCULAR | Status: AC
  Filled 2018-01-22: qty 2

## 2018-01-22 MED ORDER — SCOPOLAMINE 1 MG/3DAYS TD PT72
MEDICATED_PATCH | TRANSDERMAL | Status: AC
  Filled 2018-01-22: qty 1

## 2018-01-22 MED ORDER — ROPIVACAINE HCL 7.5 MG/ML IJ SOLN
INTRAMUSCULAR | Status: DC | PRN
  Administered 2018-01-22: 20 mL via PERINEURAL

## 2018-01-22 MED ORDER — LACTATED RINGERS IV SOLN
INTRAVENOUS | Status: DC
  Administered 2018-01-22: 11:00:00 via INTRAVENOUS
  Filled 2018-01-22: qty 1000

## 2018-01-22 MED ORDER — ACETAMINOPHEN 500 MG PO TABS
1000.0000 mg | ORAL_TABLET | Freq: Once | ORAL | Status: AC
  Administered 2018-01-22: 1000 mg via ORAL
  Filled 2018-01-22: qty 2

## 2018-01-22 MED ORDER — FENTANYL CITRATE (PF) 100 MCG/2ML IJ SOLN
INTRAMUSCULAR | Status: AC
  Filled 2018-01-22: qty 2

## 2018-01-22 MED ORDER — PROMETHAZINE HCL 25 MG/ML IJ SOLN
6.2500 mg | INTRAMUSCULAR | Status: DC | PRN
  Filled 2018-01-22: qty 1

## 2018-01-22 MED ORDER — FENTANYL CITRATE (PF) 100 MCG/2ML IJ SOLN
25.0000 ug | INTRAMUSCULAR | Status: DC | PRN
  Filled 2018-01-22: qty 1

## 2018-01-22 MED ORDER — PROPOFOL 10 MG/ML IV BOLUS
INTRAVENOUS | Status: DC | PRN
  Administered 2018-01-22: 150 mg via INTRAVENOUS

## 2018-01-22 MED ORDER — HYDROCODONE-ACETAMINOPHEN 5-325 MG PO TABS: 1.0000 | ORAL_TABLET | Freq: Four times a day (QID) | ORAL | 0 refills | Status: AC | PRN

## 2018-01-22 MED ORDER — DEXAMETHASONE SODIUM PHOSPHATE 10 MG/ML IJ SOLN
INTRAMUSCULAR | Status: AC
  Filled 2018-01-22: qty 1

## 2018-01-22 MED ORDER — LIDOCAINE HCL 4 % MT SOLN
OROMUCOSAL | Status: DC | PRN
  Administered 2018-01-22: 2 mL via TOPICAL

## 2018-01-22 MED ORDER — SODIUM CHLORIDE 0.9 % IR SOLN
Status: DC | PRN
  Administered 2018-01-22: 500 mL

## 2018-01-22 SURGICAL SUPPLY — 57 items
BLADE CLIPPER SURG (BLADE) IMPLANT
BLADE SURG 15 STRL LF DISP TIS (BLADE) ×1 IMPLANT
BLADE SURG 15 STRL SS (BLADE) ×3
CHLORAPREP W/TINT 26ML (MISCELLANEOUS) ×3 IMPLANT
CLOSURE STERI-STRIP 1/2X4 (GAUZE/BANDAGES/DRESSINGS)
CLSR STERI-STRIP ANTIMIC 1/2X4 (GAUZE/BANDAGES/DRESSINGS) IMPLANT
CONT SPEC 4OZ CLIKSEAL STRL BL (MISCELLANEOUS) ×2 IMPLANT
DECANTER SPIKE VIAL GLASS SM (MISCELLANEOUS) IMPLANT
DRAPE IMP U-DRAPE 54X76 (DRAPES) ×5 IMPLANT
DRAPE U-SHAPE 47X51 STRL (DRAPES) ×3 IMPLANT
DRAPE U-SHAPE 76X120 STRL (DRAPES) ×6 IMPLANT
DRSG EMULSION OIL 3X3 NADH (GAUZE/BANDAGES/DRESSINGS) ×3 IMPLANT
DRSG TEGADERM 4X4.75 (GAUZE/BANDAGES/DRESSINGS) IMPLANT
ELECT REM PT RETURN 9FT ADLT (ELECTROSURGICAL) ×3
ELECTRODE REM PT RTRN 9FT ADLT (ELECTROSURGICAL) ×1 IMPLANT
GAUZE SPONGE 4X4 12PLY STRL (GAUZE/BANDAGES/DRESSINGS) ×3 IMPLANT
GLOVE BIO SURGEON STRL SZ7.5 (GLOVE) ×6 IMPLANT
GLOVE BIOGEL PI IND STRL 8 (GLOVE) ×2 IMPLANT
GLOVE BIOGEL PI INDICATOR 8 (GLOVE) ×4
GOWN STRL REUS W/ TWL LRG LVL3 (GOWN DISPOSABLE) ×5 IMPLANT
GOWN STRL REUS W/ TWL XL LVL3 (GOWN DISPOSABLE) ×1 IMPLANT
GOWN STRL REUS W/TWL LRG LVL3 (GOWN DISPOSABLE) ×15
GOWN STRL REUS W/TWL XL LVL3 (GOWN DISPOSABLE) ×3
NS IRRIG 1000ML POUR BTL (IV SOLUTION) ×3 IMPLANT
PACK ARTHROSCOPY DSU (CUSTOM PROCEDURE TRAY) ×3 IMPLANT
PACK BASIN DAY SURGERY FS (CUSTOM PROCEDURE TRAY) ×3 IMPLANT
PAD ABD 8X10 STRL (GAUZE/BANDAGES/DRESSINGS) ×2 IMPLANT
PENCIL BUTTON HOLSTER BLD 10FT (ELECTRODE) ×3 IMPLANT
SLEEVE SCD COMPRESS KNEE MED (MISCELLANEOUS) IMPLANT
SLING ARM FOAM STRAP LRG (SOFTGOODS) IMPLANT
SLING ARM IMMOBILIZER LRG (SOFTGOODS) IMPLANT
SLING ARM IMMOBILIZER MED (SOFTGOODS) IMPLANT
SLING ARM MED ADULT FOAM STRAP (SOFTGOODS) IMPLANT
SLING ARM XL FOAM STRAP (SOFTGOODS) IMPLANT
SPONGE LAP 18X18 RF (DISPOSABLE) ×6 IMPLANT
STAPLER VISISTAT 35W (STAPLE) IMPLANT
SUCTION FRAZIER HANDLE 10FR (MISCELLANEOUS)
SUCTION TUBE FRAZIER 10FR DISP (MISCELLANEOUS) IMPLANT
SUT ETHILON 3 0 PS 1 (SUTURE) IMPLANT
SUT ETHILON 4 0 PS 2 18 (SUTURE) ×2 IMPLANT
SUT FIBERWIRE #2 38 T-5 BLUE (SUTURE) ×9
SUT MNCRL AB 4-0 PS2 18 (SUTURE) ×3 IMPLANT
SUT MON AB 2-0 CT1 36 (SUTURE) ×3 IMPLANT
SUT RETRIEVER MED (INSTRUMENTS) IMPLANT
SUT VIC AB 0 CT1 27 (SUTURE)
SUT VIC AB 0 CT1 27XBRD ANBCTR (SUTURE) IMPLANT
SUT VIC AB 0 SH 27 (SUTURE) ×3 IMPLANT
SUT VIC AB 2-0 SH 27 (SUTURE)
SUT VIC AB 2-0 SH 27XBRD (SUTURE) IMPLANT
SUT VIC AB 3-0 SH 27 (SUTURE)
SUT VIC AB 3-0 SH 27X BRD (SUTURE) IMPLANT
SUTURE FIBERWR #2 38 T-5 BLUE (SUTURE) ×3 IMPLANT
SYR BULB 3OZ (MISCELLANEOUS) ×3 IMPLANT
TAPE PAPER 3X10 WHT MICROPORE (GAUZE/BANDAGES/DRESSINGS) ×2 IMPLANT
TOWEL GREEN STERILE FF (TOWEL DISPOSABLE) ×3 IMPLANT
TOWEL OR NON WOVEN STRL DISP B (DISPOSABLE) ×3 IMPLANT
YANKAUER SUCT BULB TIP NO VENT (SUCTIONS) ×3 IMPLANT

## 2018-01-22 NOTE — Anesthesia Procedure Notes (Signed)
Procedure Name: Intubation Date/Time: 01/22/2018 1:11 PM Performed by: Mechele Claude, CRNA Pre-anesthesia Checklist: Patient identified, Emergency Drugs available, Suction available and Patient being monitored Patient Re-evaluated:Patient Re-evaluated prior to induction Oxygen Delivery Method: Circle system utilized Preoxygenation: Pre-oxygenation with 100% oxygen Induction Type: IV induction Ventilation: Mask ventilation without difficulty Laryngoscope Size: Mac and 3 Grade View: Grade I Tube type: Oral Tube size: 7.0 mm Number of attempts: 1 Airway Equipment and Method: Stylet and LTA kit utilized Placement Confirmation: ETT inserted through vocal cords under direct vision,  positive ETCO2 and breath sounds checked- equal and bilateral Secured at: 21 cm Tube secured with: Tape Dental Injury: Teeth and Oropharynx as per pre-operative assessment

## 2018-01-22 NOTE — H&P (Signed)
ORTHOPAEDIC CONSULTATION  REQUESTING PHYSICIAN: [name], MD  Chief Complaint: left should wound  HPI: Dawn Hartman is a 22 y.o. female who complains of scar widening with fluid collection bellow the skin.   Past Medical History:  Diagnosis Date  . Acromioclavicular joint separation 12/08/2017  . Anemia   . Asthma    Interm  . Breast mass, right 03/2012  . Dislocation of left shoulder joint    AC seperation  . Headache   . High triglycerides    takes fish oil supplement  . History of epistaxis    2-3 hours  . History of MRSA infection age 59   finger  . Ileitis 01/15/2014  . 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 Ovarian cyst   . Vasovagal episode    during nosebleed   Past Surgical History:  Procedure Laterality Date  . ADENOIDECTOMY    . BREAST LUMPECTOMY WITH NEEDLE LOCALIZATION  04/15/2012   Procedure: BREAST LUMPECTOMY WITH NEEDLE LOCALIZATION;  Surgeon: Rolm Bookbinder, MD;  Location: [facility];  Service: General;  Laterality: Right;  right breast wire localized mass excision  . BREAST SURGERY    . MYRINGOTOMY  04/1999; 04/2002  . NASAL CAUTERIZATION  Bilateral   . RECONSTRUCTION OF CORACOCLAVICULAR LIGAMENT Left 12/18/2017   Procedure: RECONSTRUCTION OF CORACOCLAVICULAR LIGAMENT LEFT SHOULDER;  Surgeon: [name], MD;  Location: [facility];  Service: Orthopedics;  Laterality: Left;  . TONSILLECTOMY  03/2010   Social History   Socioeconomic History  . Marital status: Single    Spouse name: Not on file  . Number of children: Not on file  . Years of education: Not on file  . Highest education level: Not on file  Occupational History  . Not on file  Social Needs  . Financial resource strain: Not on file  . Food insecurity:    Worry: Not on file    Inability: Not on file  . Transportation needs:    Medical: Not on file    Non-medical: Not on file  Tobacco Use  . Smoking status: Never Smoker  .  Smokeless tobacco: Never Used  Substance and Sexual Activity  . Alcohol use: No  . Drug use: No  . Sexual activity: Never    Birth control/protection: Abstinence  Lifestyle  . Physical activity:    Days per week: Not on file    Minutes per session: Not on file  . Stress: Not on file  Relationships  . Social connections:    Talks on phone: Not on file    Gets together: Not on file    Attends religious service: Not on file    Active member of club or organization: Not on file    Attends meetings of clubs or organizations: Not on file    Relationship status: Not on file  Other Topics Concern  . Not on file  Social History Narrative  . Not on file   Family History  Problem Relation Age of Onset  . Heart disease Paternal Grandfather        atrial fib.  . Asthma Paternal Grandfather        as a child  . Diabetes Maternal Grandmother   . Hypertension Maternal Grandmother   . Leukemia Maternal Grandfather   . Cancer Paternal Grandmother        breast CA  . Anesthesia problems Mother        post-op N/V  . Asthma Sister  Allergies  Allergen Reactions  . Adhesive [Tape]     Paper tape only   Prior to Admission medications   Medication Sig Start Date End Date Taking? Authorizing Provider  albuterol (PROVENTIL HFA;VENTOLIN HFA) 108 (90 Base) MCG/ACT inhaler Inhale 1-2 puffs into the lungs every 6 (six) hours as needed for wheezing or shortness of breath.    [provider]  ibuprofen (ADVIL,MOTRIN) 400 MG tablet Take 1 tablet (400 mg total) by mouth every 8 (eight) hours as needed. Patient taking differently: Take 400 mg by mouth every 8 (eight) hours as needed for moderate pain.  12/09/17   Jola Schmidt, MD  IRON, FERROUS SULFATE, PO Take 1 tablet by mouth daily.     [provider]   No results found.  Positive ROS: All other systems have been reviewed and were otherwise negative with the exception of those mentioned in the HPI and as above.  Labs  cbc No results for input(s): WBC, HGB, HCT, PLT in the last 72 hours.  Labs inflam No results for input(s): CRP in the last 72 hours.  Invalid input(s): ESR  Labs coag No results for input(s): INR, PTT in the last 72 hours.  Invalid input(s): PT  No results for input(s): NA, K, CL, CO2, GLUCOSE, BUN, CREATININE, CALCIUM in the last 72 hours.  Physical Exam: There were no vitals filed for this visit. General: Alert, no acute distress Cardiovascular: No pedal edema Respiratory: No cyanosis, no use of accessory musculature GI: No organomegaly, abdomen is soft and non-tender Skin: No lesions in the area of chief complaint other than those listed below in MSK exam.  Neurologic: Sensation intact distally save for the below mentioned MSK exam Psychiatric: Patient is competent for consent with normal mood and affect Lymphatic: No axillary or cervical lymphadenopathy  MUSCULOSKELETAL:  LUE: incision with no erythema but she has dermal separation with scar widening.  Other extremities are atraumatic with painless ROM and NVI.  Assessment: Likely skin reaction to subdermal stitch  Plan: OR today for debridement and wound repair.    [name], MD Cell [phone]   01/22/2018 10:10 AM

## 2018-01-22 NOTE — Progress Notes (Addendum)
Upon entering Phase 2 room, mother pointed out left eye swollen. Swelling limited top eyelid. Sclera without redness, able to move eyeball and open and close eyelid. Admits to lesser sensation to eyelid when touched as well as periorbital area and cheek compared to right side of face. Left ear also "tingling. Speech clear, no slurring. No difficulty swallowing. Dr. Gifford Shave aware. No new orders given. Will monitor. Antibiotic prescriptions labelled one and two. Clarified that antibiotics are to be taken simultaneously, not in succession. Mother aware.

## 2018-01-22 NOTE — Discharge Instructions (Signed)
Diet: As you were doing prior to hospitalization   Shower:  May shower but keep the wounds dry, use an occlusive plastic wrap, NO SOAKING IN TUB.  If the bandage gets wet, change with a clean dry gauze.  If you have a splint on, leave the splint in place and keep the splint dry with a plastic bag.  Dressing:  Keep dressings on and dry until follow up.  Activity:  Increase activity slowly as tolerated, but follow the weight bearing instructions below.  The rules on driving is that you can not be taking narcotics while you drive, and you must feel in control of the vehicle.    Weight Bearing:  Non-weight bearing left arm.  To prevent constipation: you may use a stool softener such as -  Colace (over the counter) 100 mg by mouth twice a day  Drink plenty of fluids (prune juice may be helpful) and high fiber foods Miralax (over the counter) for constipation as needed.    Itching:  If you experience itching with your medications, try taking only a single pain pill, or even half a pain pill at a time.  You can also use benadryl over the counter for itching or also to help with sleep.   Precautions:  If you experience chest pain or shortness of breath - call 911 immediately for transfer to the hospital emergency department!!  If you develop a fever greater that 101 F, purulent drainage from wound, increased redness or drainage from wound, or calf pain -- Call the office at [phone]                                                 Follow- Up Appointment:  Please call for an appointment to be seen in 1 week [place] - [phone]     Post Anesthesia Home Care Instructions  Activity: Get plenty of rest for the remainder of the day. A responsible individual must stay with you for 24 hours following the procedure.  For the next 24 hours, DO NOT: -Drive a car -Paediatric nurse -Drink alcoholic beverages -Take any medication unless instructed by your physician -Make any legal decisions  or sign important papers.  Meals: Start with liquid foods such as gelatin or soup. Progress to regular foods as tolerated. Avoid greasy, spicy, heavy foods. If nausea and/or vomiting occur, drink only clear liquids until the nausea and/or vomiting subsides. Call your physician if vomiting continues.  Special Instructions/Symptoms: Your throat may feel dry or sore from the anesthesia or the breathing tube placed in your throat during surgery. If this causes discomfort, gargle with warm salt water. The discomfort should disappear within 24 hours.  If you had a scopolamine patch placed behind your ear for the management of post- operative nausea and/or vomiting:  1. The medication in the patch is effective for 72 hours, after which it should be removed.  Wrap patch in a tissue and discard in the trash. Wash hands thoroughly with soap and water. 2. You may remove the patch earlier than 72 hours if you experience unpleasant side effects which may include dry mouth, dizziness or visual disturbances. 3. Avoid touching the patch. Wash your hands with soap and water after contact with the patch.

## 2018-01-22 NOTE — Anesthesia Procedure Notes (Signed)
Anesthesia Regional Block: Interscalene brachial plexus block   Pre-Anesthetic Checklist: ,, timeout performed, Correct Patient, Correct Site, Correct Laterality, Correct Procedure, Correct Position, site marked, Risks and benefits discussed,  Surgical consent,  Pre-op evaluation,  At surgeon's request and post-op pain management  Laterality: Left  Prep: chloraprep       Needles:  Injection technique: Single-shot  Needle Type: Echogenic Needle     Needle Length: 5cm  Needle Gauge: 21     Additional Needles:   Procedures:,,,, ultrasound used (permanent image in chart),,,,  Narrative:  Start time: 01/22/2018 11:49 AM End time: 01/22/2018 11:54 AM Injection made incrementally with aspirations every 5 mL.  Performed by: Personally  Anesthesiologist: Catalina Gravel, MD  Additional Notes: No pain on injection. No increased resistance to injection. Injection made in 5cc increments.  Good needle visualization.  Patient tolerated procedure well.

## 2018-01-22 NOTE — Transfer of Care (Signed)
Last Vitals:  Vitals Value Taken Time  BP 118/69 01/22/2018  1:54 PM  Temp 36.9 C 01/22/2018  1:54 PM  Pulse 87 01/22/2018  1:58 PM  Resp 16 01/22/2018  1:58 PM  SpO2 98 % 01/22/2018  1:58 PM  Vitals shown include unvalidated device data.  Last Pain:  Vitals:   01/22/18 1354  PainSc: 0-No pain        Immediate Anesthesia Transfer of Care Note  Patient: Dawn Hartman  Procedure(s) Performed: Procedure(s) (LRB): INCISION AND DRAINAGE LEFT SHOULDER (Left)  Patient Location: PACU  Anesthesia Type: General  Level of Consciousness: awake, alert  and oriented  Airway & Oxygen Therapy: Patient Spontanous Breathing and Patient connected to nasal cannula oxygen  Post-op Assessment: Report given to PACU RN and Post -op Vital signs reviewed and stable  Post vital signs: Reviewed and stable  Complications: No apparent anesthesia complications

## 2018-01-22 NOTE — Interval H&P Note (Signed)
History and Physical Interval Note:  01/22/2018 10:12 AM  Dawn Hartman  has presented today for surgery, with the diagnosis of left shoulder absess  The various methods of treatment have been discussed with the patient and family. After consideration of risks, benefits and other options for treatment, the patient has consented to  Procedure(s): INCISION AND DRAINAGE LEFT SHOULDER (Left) as a surgical intervention .  The patient's history has been reviewed, patient examined, no change in status, stable for surgery.  I have reviewed the patient's chart and labs.  Questions were answered to the patient's satisfaction.     [name]

## 2018-01-22 NOTE — Anesthesia Preprocedure Evaluation (Addendum)
Anesthesia Evaluation  Patient identified by MRN, date of birth, ID band Patient awake    Reviewed: Allergy & Precautions, NPO status , Patient's Chart, lab work & pertinent test results  History of Anesthesia Complications (+) PONV, Family history of anesthesia reaction and history of anesthetic complications  Airway Mallampati: II  TM Distance: >3 FB Neck ROM: Full    Dental  (+) Teeth Intact, Dental Advisory Given Upper and lower braces :   Pulmonary asthma ,    Pulmonary exam normal breath sounds clear to auscultation       Cardiovascular Exercise Tolerance: Good negative cardio ROS Normal cardiovascular exam Rhythm:Regular Rate:Normal     Neuro/Psych  Headaches,    GI/Hepatic negative GI ROS, Neg liver ROS,   Endo/Other  negative endocrine ROS  Renal/GU negative Renal ROS     Musculoskeletal negative musculoskeletal ROS (+)   Abdominal   Peds  Hematology negative hematology ROS (+)   Anesthesia Other Findings Day of surgery medications reviewed with the patient.  Reproductive/Obstetrics negative OB ROS                             Anesthesia Physical Anesthesia Plan  ASA: II  Anesthesia Plan: General   Post-op Pain Management:  Regional for Post-op pain   Induction: Intravenous  PONV Risk Score and Plan: 4 or greater and Diphenhydramine, Scopolamine patch - Pre-op, Midazolam, Dexamethasone and Ondansetron  Airway Management Planned: Oral ETT  Additional Equipment:   Intra-op Plan:   Post-operative Plan: Extubation in OR  Informed Consent: I have reviewed the patients History and Physical, chart, labs and discussed the procedure including the risks, benefits and alternatives for the proposed anesthesia with the patient or authorized representative who has indicated his/her understanding and acceptance.   Dental advisory given  Plan Discussed with: CRNA  Anesthesia  Plan Comments:        Anesthesia Quick Evaluation

## 2018-01-23 ENCOUNTER — Encounter (HOSPITAL_BASED_OUTPATIENT_CLINIC_OR_DEPARTMENT_OTHER): Payer: Self-pay | Admitting: Orthopedic Surgery

## 2018-01-23 NOTE — Anesthesia Postprocedure Evaluation (Signed)
Anesthesia Post Note  Patient: Noberto Retort  Procedure(s) Performed: INCISION AND DRAINAGE LEFT SHOULDER (Left Shoulder)     Patient location during evaluation: PACU Anesthesia Type: General Level of consciousness: awake and alert, awake and oriented Pain management: pain level controlled Vital Signs Assessment: post-procedure vital signs reviewed and stable Respiratory status: spontaneous breathing, nonlabored ventilation and respiratory function stable Cardiovascular status: blood pressure returned to baseline and stable Postop Assessment: no apparent nausea or vomiting Anesthetic complications: no    Last Vitals:  Vitals:   01/22/18 1445 01/22/18 1601  BP: (!) 97/51 112/71  Pulse: 74 88  Resp: 17 16  Temp:  37.4 C  SpO2: 96% 96%    Last Pain:  Vitals:   01/23/18 1016  PainSc: 0-No pain                 Catalina Gravel

## 2018-01-25 NOTE — Op Note (Signed)
01/22/2018  8:27 AM  PATIENT:  Dawn Hartman    PRE-OPERATIVE DIAGNOSIS:  left shoulder absess  POST-OPERATIVE DIAGNOSIS:  Same  PROCEDURE:  INCISION AND DRAINAGE LEFT SHOULDER  SURGEON:  [name], MD  ASSISTANT: Roxan Hockey, PA-C, he was present and scrubbed throughout the case, critical for completion in a timely fashion, and for retraction, instrumentation, and closure.   ANESTHESIA:   gen  PREOPERATIVE INDICATIONS:  Dawn Hartman is a  22 y.o. female with a diagnosis of left shoulder absess who failed conservative measures and elected for surgical management.    The risks benefits and alternatives were discussed with the patient preoperatively including but not limited to the risks of infection, bleeding, nerve injury, cardiopulmonary complications, the need for revision surgery, among others, and the patient was willing to proceed.  OPERATIVE IMPLANTS: none  OPERATIVE FINDINGS: serous fluid   BLOOD LOSS: min  COMPLICATIONS: none  TOURNIQUET TIME: none  OPERATIVE PROCEDURE:  Patient was identified in the preoperative holding area and site was marked by me She was transported to the operating theater and placed on the table in supine position taking care to pad all bony prominences. After a preincinduction time out anesthesia was induced. The left upper extremity was prepped and draped in normal sterile fashion and a pre-incision timeout was performed. She received ancef for preoperative antibiotics.   I made an incision through her hypertrophic scar formation there was a fluid collection below this of serous fluid below that was her intact repair of her deep layer over top of her cc repeat reconstruction.  I ellipsed out the hypertrophic skin.  I again probed deep and did not find any penetration of the deep closure.  I extended the incision laterally and removed all Monocryl stitches that she was reacting to and were about to be expressed on the lateral  aspect of the incision I then performed a thorough irrigation.  I then performed a complex closure of this wound.  Sterile dressing was applied she was awoken and taken the PACU in stable condition  POST OPERATIVE PLAN: Sling for 2wks. Mobilize for dvt px

## 2018-01-27 LAB — AEROBIC/ANAEROBIC CULTURE W GRAM STAIN (SURGICAL/DEEP WOUND)

## 2018-01-27 LAB — AEROBIC/ANAEROBIC CULTURE (SURGICAL/DEEP WOUND)

## 2018-02-08 ENCOUNTER — Other Ambulatory Visit: Payer: Self-pay | Admitting: Registered Nurse

## 2018-02-08 ENCOUNTER — Other Ambulatory Visit: Payer: Self-pay

## 2018-02-08 ENCOUNTER — Encounter (HOSPITAL_COMMUNITY): Payer: Self-pay

## 2018-02-08 ENCOUNTER — Inpatient Hospital Stay (HOSPITAL_COMMUNITY)
Admission: AD | Admit: 2018-02-08 | Discharge: 2018-02-10 | DRG: 885 | Disposition: A | Discharge status: Home / Self Care | Payer: BLUE CROSS/BLUE SHIELD | Source: Intra-hospital | Attending: Psychiatry | Admitting: Psychiatry

## 2018-02-08 DIAGNOSIS — G4700 Insomnia, unspecified: Secondary | ICD-10-CM | POA: Diagnosis present

## 2018-02-08 DIAGNOSIS — Z915 Personal history of self-harm: Secondary | ICD-10-CM

## 2018-02-08 DIAGNOSIS — F332 Major depressive disorder, recurrent severe without psychotic features: Principal | ICD-10-CM | POA: Diagnosis present

## 2018-02-08 DIAGNOSIS — Z818 Family history of other mental and behavioral disorders: Secondary | ICD-10-CM | POA: Diagnosis not present

## 2018-02-08 DIAGNOSIS — Z833 Family history of diabetes mellitus: Secondary | ICD-10-CM

## 2018-02-08 DIAGNOSIS — Z8249 Family history of ischemic heart disease and other diseases of the circulatory system: Secondary | ICD-10-CM | POA: Diagnosis not present

## 2018-02-08 DIAGNOSIS — J45909 Unspecified asthma, uncomplicated: Secondary | ICD-10-CM | POA: Diagnosis present

## 2018-02-08 DIAGNOSIS — D649 Anemia, unspecified: Secondary | ICD-10-CM | POA: Diagnosis present

## 2018-02-08 DIAGNOSIS — Z91040 Latex allergy status: Secondary | ICD-10-CM

## 2018-02-08 DIAGNOSIS — J189 Pneumonia, unspecified organism: Secondary | ICD-10-CM | POA: Diagnosis present

## 2018-02-08 MED ORDER — TRAZODONE HCL 50 MG PO TABS: 50.0000 mg | ORAL_TABLET | Freq: Every evening | ORAL | Status: DC | PRN

## 2018-02-08 MED ORDER — CEPHALEXIN 500 MG PO CAPS
500.0000 mg | ORAL_CAPSULE | Freq: Four times a day (QID) | ORAL | Status: AC
  Administered 2018-02-08 – 2018-02-09 (×6): 500 mg via ORAL
  Filled 2018-02-08 (×5): qty 1
  Filled 2018-02-08 (×2): qty 2
  Filled 2018-02-08: qty 1
  Filled 2018-02-08: qty 2

## 2018-02-08 MED ORDER — BENZONATATE 100 MG PO CAPS
100.0000 mg | ORAL_CAPSULE | Freq: Three times a day (TID) | ORAL | Status: DC | PRN
  Administered 2018-02-08 – 2018-02-09 (×2): 100 mg via ORAL
  Filled 2018-02-08 (×2): qty 1

## 2018-02-08 MED ORDER — HYDROXYZINE HCL 25 MG PO TABS
25.0000 mg | ORAL_TABLET | Freq: Four times a day (QID) | ORAL | Status: DC | PRN
  Administered 2018-02-08 – 2018-02-09 (×2): 25 mg via ORAL
  Filled 2018-02-08 (×2): qty 1

## 2018-02-08 MED ORDER — ALBUTEROL SULFATE HFA 108 (90 BASE) MCG/ACT IN AERS
1.0000 | INHALATION_SPRAY | Freq: Four times a day (QID) | RESPIRATORY_TRACT | Status: DC | PRN
  Administered 2018-02-08 – 2018-02-10 (×3): 2 via RESPIRATORY_TRACT
  Filled 2018-02-08: qty 6.7

## 2018-02-08 MED ORDER — CEPHALEXIN 500 MG PO CAPS
500.0000 mg | ORAL_CAPSULE | Freq: Four times a day (QID) | ORAL | Status: DC
  Filled 2018-02-08 (×3): qty 1

## 2018-02-08 MED ORDER — SERTRALINE HCL 50 MG PO TABS
50.0000 mg | ORAL_TABLET | Freq: Every day | ORAL | Status: DC
  Administered 2018-02-08 – 2018-02-10 (×3): 50 mg via ORAL
  Filled 2018-02-08 (×6): qty 1

## 2018-02-08 MED ORDER — PREDNISONE 20 MG PO TABS
40.0000 mg | ORAL_TABLET | Freq: Every day | ORAL | Status: DC
  Administered 2018-02-09: 40 mg via ORAL
  Filled 2018-02-08 (×2): qty 2
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
## 2018-02-08 NOTE — Progress Notes (Signed)
Adult Psychoeducational Group Note  Date:  02/08/2018 Time:  9:40 PM  Group Topic/Focus:  Wrap-Up Group:   The focus of this group is to help patients review their daily goal of treatment and discuss progress on daily workbooks.  Participation Level:  Active  Participation Quality:  Appropriate  Affect:  Appropriate  Cognitive:  Appropriate  Insight: Appropriate  Engagement in Group:  Engaged  Modes of Intervention:  Discussion  Additional Comments:  Pt was just admitted today.  Pt stated her day was not the best but not mad either.  Pt rated the day at a 4/10.  Alva Broxson 02/08/2018, 9:40 PM
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
## 2018-02-09 LAB — BASIC METABOLIC PANEL
Anion gap: 7 (ref 5–15)
BUN: 14 mg/dL (ref 6–20)
CALCIUM: 9.4 mg/dL (ref 8.9–10.3)
CO2: 29 mmol/L (ref 22–32)
Chloride: 104 mmol/L (ref 98–111)
Creatinine, Ser: 0.7 mg/dL (ref 0.44–1.00)
GFR calc Af Amer: >60 mL/min (ref >60)
GFR calc non Af Amer: >60 mL/min (ref >60)
GLUCOSE: 96 mg/dL (ref 70–99)
Potassium: 3.6 mmol/L (ref 3.5–5.1)
Sodium: 140 mmol/L (ref 135–145)

## 2018-02-09 LAB — CBC WITH DIFFERENTIAL/PLATELET
Abs Immature Granulocytes: 0.02 10*3/uL (ref 0.00–0.07)
Basophils Absolute: 0.1 10*3/uL (ref 0.0–0.1)
Basophils Relative: 1 %
EOS ABS: 0.1 10*3/uL (ref 0.0–0.5)
EOS PCT: 2 %
HEMATOCRIT: 41.1 % (ref 36.0–46.0)
Hemoglobin: 12.8 g/dL (ref 12.0–15.0)
IMMATURE GRANULOCYTES: 0 %
LYMPHS ABS: 2.5 10*3/uL (ref 0.7–4.0)
Lymphocytes Relative: 38 %
MCH: 28.2 pg (ref 26.0–34.0)
MCHC: 31.1 g/dL (ref 30.0–36.0)
MCV: 90.5 fL (ref 80.0–100.0)
MONOS PCT: 7 %
Monocytes Absolute: 0.5 10*3/uL (ref 0.1–1.0)
NEUTROS PCT: 52 %
Neutro Abs: 3.4 10*3/uL (ref 1.7–7.7)
Platelets: 307 10*3/uL (ref 150–400)
RBC: 4.54 MIL/uL (ref 3.87–5.11)
RDW: 12.7 % (ref 11.5–15.5)
WBC: 6.6 10*3/uL (ref 4.0–10.5)
nRBC: 0 % (ref 0.0–0.2)

## 2018-02-09 MED ORDER — PREDNISONE 20 MG PO TABS
30.0000 mg | ORAL_TABLET | Freq: Every day | ORAL | Status: DC
  Administered 2018-02-10: 30 mg via ORAL
  Filled 2018-02-09 (×2): qty 1

## 2018-02-09 MED ORDER — IBUPROFEN 600 MG PO TABS
600.0000 mg | ORAL_TABLET | Freq: Four times a day (QID) | ORAL | Status: DC | PRN
  Administered 2018-02-09: 600 mg via ORAL
  Filled 2018-02-09: qty 1
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
## 2018-02-09 NOTE — Progress Notes (Signed)
Patient verbalized in group that she felt "happier" today and that she was able to converse more with her peers. Her goal for tomorrow is to try to "come out of my shell".

## 2018-02-09 NOTE — Progress Notes (Signed)
D.  Pt pleasant on approach, no complaints voiced at this time.  Pt was positive for evening wrap up group, observed engaged in appropriate interaction with peers on the unit.  Pt denies SI/HI/AVH at this time.  A.  Support and encouragement offered, medication given as ordered  R.  Pt remains safe on the unit, will continue to monitor.   

## 2018-02-09 NOTE — Progress Notes (Addendum)
D Pt is observed OOB UAL on hte [address] hall today. She tolerates this well. SHe is appropriate, articulate in her explanation to this Probation officer today about ehr history. SHe readily admits to writer that " I've been having a hard time for awhile now" and is able to identify how one difficult trauma in her life led to her not being able to handle another trauma that happened soon after the first one.Her affect is flat and depressed. A She demonstrates organized thinking and she modulates her behavior well as evidenced by her not losing control.      A She attends her groups and she is engaged in her recovery as evidenced by her  Identifying  unhealthy things she has been doing and can articulate what she understands to be unhealthy coping skills : having  Unrealistic expectations of people and relationships, needing to grieve, being physically ill and pushing herslef too much.      R Safety is in place and poc cont.

## 2018-02-09 NOTE — Progress Notes (Signed)
[facility] MD Progress Note  02/09/2018 8:59 AM Dawn Hartman  MRN:  [id number] Subjective:  Reports she is feeling " a lot better today". Denies medication side effects. Denies suicidal ideations. Reports she had a good visit with her parents yesterday.  Objective: I have reviewed chart notes and have met with patient. 22 year old female, lives with her parents, presented to ED following an episode of attempted overdose on prescribed opiates, which she states she had not been taking ( were left over from a prior prescription) . States overdose was impulsive, in the context of an argument with family. Does report she had been feeling depressed, with neuro-vegetative symptoms which she attributes in part to L shoulder surgeries x 2 following a horseback riding accident , and subsequent decreased mobility and increased dependence on others for daily activities. Reports she was recently diagnosed with pneumonia as well .  Patient reports she is feeling better, less depressed. Denies suicidal ideations. Does present with an improved mood and improving range of affect .  Denies medication side effects at this time. No disruptive or agitated behaviors on unit, yesterday presented tearful, anxious but states feeling less so today, and affect improved and fuller . Labs reviewed - WNL Principal Problem: MDD Diagnosis:   Patient Active Problem List   Diagnosis Date Noted  . MDD (major depressive disorder), recurrent episode, severe ([place]) [F33.2] 02/08/2018   Total Time spent with patient: 20 minutes  Past Psychiatric History:   Past Medical History:  Past Medical History:  Diagnosis Date  . Acromioclavicular joint separation 12/08/2017  . Anemia   . Asthma    Interm  . Breast mass, right 03/2012  . Complication of anesthesia   . Dislocation of left shoulder joint    AC seperation  . Family history of adverse reaction to anesthesia    mother vomits while under anesthesia  . Headache   . High  triglycerides    takes fish oil supplement  . History of epistaxis    2-3 hours  . History of MRSA infection age 22   finger  . Ileitis 01/15/2014  . 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 Ovarian cyst   . PONV (postoperative nausea and vomiting)   . Vasovagal episode    during nosebleed    Past Surgical History:  Procedure Laterality Date  . ADENOIDECTOMY    . BREAST LUMPECTOMY WITH NEEDLE LOCALIZATION  04/15/2012   Procedure: BREAST LUMPECTOMY WITH NEEDLE LOCALIZATION;  Surgeon: Rolm Bookbinder, MD;  Location: [facility];  Service: General;  Laterality: Right;  right breast wire localized mass excision  . BREAST SURGERY    . INCISION AND DRAINAGE Left 01/22/2018   Procedure: INCISION AND DRAINAGE LEFT SHOULDER;  Surgeon: [name], MD;  Location: [facility];  Service: Orthopedics;  Laterality: Left;  . MYRINGOTOMY  04/1999; 04/2002  . NASAL CAUTERIZATION  Bilateral   . RECONSTRUCTION OF CORACOCLAVICULAR LIGAMENT Left 12/18/2017   Procedure: RECONSTRUCTION OF CORACOCLAVICULAR LIGAMENT LEFT SHOULDER;  Surgeon: [name], MD;  Location: [facility];  Service: Orthopedics;  Laterality: Left;  . TONSILLECTOMY  03/2010   Family History:  Family History  Problem Relation Age of Onset  . Heart disease Paternal Grandfather        atrial fib.  . Asthma Paternal Grandfather        as a child  . Diabetes Maternal Grandmother   . Hypertension Maternal Grandmother   . Leukemia Maternal Grandfather   .  Cancer Paternal Grandmother        breast CA  . Anesthesia problems Mother        post-op N/V  . Asthma Sister    Family Psychiatric  History:  Social History:  Social History   Substance and Sexual Activity  Alcohol Use No     Social History   Substance and Sexual Activity  Drug Use No    Social History   Socioeconomic History  . Marital status: Single    Spouse name: Not on file  . Number of children: Not on file  .  Years of education: Not on file  . Highest education level: Not on file  Occupational History  . Not on file  Social Needs  . Financial resource strain: Not on file  . Food insecurity:    Worry: Not on file    Inability: Not on file  . Transportation needs:    Medical: Not on file    Non-medical: Not on file  Tobacco Use  . Smoking status: Never Smoker  . Smokeless tobacco: Never Used  Substance and Sexual Activity  . Alcohol use: No  . Drug use: No  . Sexual activity: Never    Birth control/protection: Abstinence  Lifestyle  . Physical activity:    Days per week: Not on file    Minutes per session: Not on file  . Stress: Not on file  Relationships  . Social connections:    Talks on phone: Not on file    Gets together: Not on file    Attends religious service: Not on file    Active member of club or organization: Not on file    Attends meetings of clubs or organizations: Not on file    Relationship status: Not on file  Other Topics Concern  . Not on file  Social History Narrative  . Not on file   Additional Social History:    Pain Medications: see PTA meds Prescriptions: see PTA meds Over the Counter: see PTA meds History of alcohol / drug use?: No history of alcohol / drug abuse  Sleep: improving   Appetite:  improving   Current Medications: Current Facility-Administered Medications  Medication Dose Route Frequency Provider Last Rate Last Dose  . albuterol (PROVENTIL HFA;VENTOLIN HFA) 108 (90 Base) MCG/ACT inhaler 1-2 puff  1-2 puff Inhalation Q6H PRN Cobos, Myer Peer, MD   2 puff at 02/09/18 0851  . benzonatate (TESSALON) capsule 100 mg  100 mg Oral TID PRN Cobos, Myer Peer, MD   100 mg at 02/08/18 2128  . cephALEXin (KEFLEX) capsule 500 mg  500 mg Oral Q6H Cobos, Myer Peer, MD   500 mg at 02/09/18 0630  . hydrOXYzine (ATARAX/VISTARIL) tablet 25 mg  25 mg Oral Q6H PRN Cobos, Myer Peer, MD   25 mg at 02/08/18 2350  . predniSONE (DELTASONE) tablet 40 mg  40  mg Oral Q breakfast Cobos, Myer Peer, MD   40 mg at 02/09/18 0851  . sertraline (ZOLOFT) tablet 50 mg  50 mg Oral Daily Cobos, Myer Peer, MD   50 mg at 02/09/18 0851  . traZODone (DESYREL) tablet 50 mg  50 mg Oral QHS PRN Cobos, Myer Peer, MD        Lab Results:  Results for orders placed or performed during the hospital encounter of 02/08/18 (from the past 48 hour(s))  CBC with Differential/Platelet     Status: None   Collection Time: 02/09/18  6:39 AM  Result Value Ref  Range   WBC 6.6 4.0 - 10.5 K/uL   RBC 4.54 3.87 - 5.11 MIL/uL   Hemoglobin 12.8 12.0 - 15.0 g/dL   HCT 41.1 36.0 - 46.0 %   MCV 90.5 80.0 - 100.0 fL   MCH 28.2 26.0 - 34.0 pg   MCHC 31.1 30.0 - 36.0 g/dL   RDW 12.7 11.5 - 15.5 %   Platelets 307 150 - 400 K/uL   nRBC 0.0 0.0 - 0.2 %   Neutrophils Relative % 52 %   Neutro Abs 3.4 1.7 - 7.7 K/uL   Lymphocytes Relative 38 %   Lymphs Abs 2.5 0.7 - 4.0 K/uL   Monocytes Relative 7 %   Monocytes Absolute 0.5 0.1 - 1.0 K/uL   Eosinophils Relative 2 %   Eosinophils Absolute 0.1 0.0 - 0.5 K/uL   Basophils Relative 1 %   Basophils Absolute 0.1 0.0 - 0.1 K/uL   Immature Granulocytes 0 %   Abs Immature Granulocytes 0.02 0.00 - 0.07 K/uL    Comment: Performed at [facility], [place] [address] [place], [place] [address]  Basic metabolic panel     Status: None   Collection Time: 02/09/18  6:39 AM  Result Value Ref Range   Sodium 140 135 - 145 mmol/L   Potassium 3.6 3.5 - 5.1 mmol/L   Chloride 104 98 - 111 mmol/L   CO2 29 22 - 32 mmol/L   Glucose, Bld 96 70 - 99 mg/dL   BUN 14 6 - 20 mg/dL   Creatinine, Ser 0.70 0.44 - 1.00 mg/dL   Calcium 9.4 8.9 - 10.3 mg/dL   GFR calc non Af Amer >60 >60 mL/min   GFR calc Af Amer >60 >60 mL/min    Comment: (NOTE) The eGFR has been calculated using the CKD EPI equation. This calculation has not been validated in all clinical situations. eGFR's persistently <60 mL/min signify possible Chronic Kidney Disease.     Anion gap 7 5 - 15    Comment: Performed at [facility], [place] [address], [place],  [address]    Blood Alcohol level:  Lab Results  Component Value Date   ETH <10 18/29/9371    Metabolic Disorder Labs: No results found for: HGBA1C, MPG No results found for: PROLACTIN No results found for: CHOL, TRIG, HDL, CHOLHDL, VLDL, LDLCALC  Physical Findings: AIMS: Facial and Oral Movements Muscles of Facial Expression: None, normal Lips and Perioral Area: None, normal Jaw: None, normal Tongue: None, normal,Extremity Movements Upper (arms, wrists, hands, fingers): None, normal Lower (legs, knees, ankles, toes): None, normal, Trunk Movements Neck, shoulders, hips: None, normal, Overall Severity Severity of abnormal movements (highest score from questions above): None, normal Incapacitation due to abnormal movements: None, normal Patient's awareness of abnormal movements (rate only patient's report): No Awareness, Dental Status Current problems with teeth and/or dentures?: No Does patient usually wear dentures?: No  CIWA:    COWS:     Musculoskeletal: Strength & Muscle Tone: within normal limits Gait & Station: normal Patient leans: N/A  Psychiatric Specialty Exam: Physical Exam  ROS no headache, no chest pain, no dyspnea, decreased coughing , no vomiting, no diarrhea   Blood pressure 125/75, pulse (!) 104, temperature 99.9 F (37.7 C), temperature source Oral, resp. rate 18, height _0  (1.702 m), weight 84.4 kg, SpO2 99 %.Body mass index is 29.13 kg/m.  General Appearance: Well Groomed  Eye Contact:  Good  Speech:  Normal Rate  Volume:  Normal  Mood:  improving mood , reports feeling better today  Affect:  more reactive, fuller in range   Thought Process:  Linear and Descriptions of Associations: Intact  Orientation:  Full (Time, Place, and Person)  Thought Content:  no hallucinations, no delusions   Suicidal Thoughts:  No denies suicidal or self  injurious ideations, denies homicidal or violent ideations  Homicidal Thoughts:  No  Memory:  recent and remote grossly intact   Judgement:  Other:  improving  Insight:  improving  Psychomotor Activity:  Normal  Concentration:  Concentration: Good and Attention Span: Good  Recall:  Good  Fund of Knowledge:  Good  Language:  Good  Akathisia:  Negative  Handed:  Right  AIMS (if indicated):     Assets:  Desire for Improvement Resilience  ADL's:  Intact  Cognition:  WNL  Sleep:  Number of Hours: 5.25   Assessment -  22 year old female, lives with her parents, presented to ED following an episode of attempted overdose on prescribed opiates, which she states she had not been taking ( were left over from a prior prescription) . States overdose was impulsive, in the context of an argument with family. Does report she had been feeling depressed, with neuro-vegetative symptoms which she attributes in part to L shoulder surgeries x 2 following a horseback riding accident , and subsequent decreased mobility and increased dependence on others for daily activities. Reports she was recently diagnosed with pneumonia as well .  Today she is feeling better, less depressed, and presents with a fuller range of affect. Denies suicidal ideations and is future oriented. She reports improving cough, no dyspnea, and presents comfortable, in no acute distress. Thus far tolerating medications well .  Treatment Plan Summary: Daily contact with patient to assess and evaluate symptoms and progress in treatment, Medication management, Plan inpatient treatment  and medications as below Encourage group and milieu participation to work on coping skills and symptom reduction Treatment team working on disposition planning  Continue Trazodone 50 mgrs QHS PRN for insomnia Continue Vistaril 25 mgrs Q 6 hours PRN for anxiety Continue Zoloft 50 mgrs QDAY for depression Decrease Prednisone to 30 mgrs QDAY tomorrow and proceed  with taper ( cut down by 10 mgrs daily until D/C) Continue Keflex for pneumonia Continue Tessalon PRN for coughing as needed  Jenne Campus, MD 02/09/2018, 8:59 AM

## 2018-02-09 NOTE — BHH Group Notes (Signed)
[facility] Group Notes:  (Nursing)  Date:  02/09/2018  Time: 1:15 PM Type of Therapy:  Nurse Education  Participation Level:  Active  Participation Quality:  Appropriate  Affect:  Appropriate  Cognitive:  Appropriate  Insight:  Appropriate  Engagement in Group:  Engaged  Modes of Intervention:  Discussion and Education  Summary of Progress/Problems: Nurse led group on [place] 02/09/2018, 5:48 PM

## 2018-02-09 NOTE — Progress Notes (Signed)
Pt is new to the unit late this afternoon just prior to shift change.  On first approach, pt was tearful and apprehensive, but after she spent some time in the dayroom talking with other patients and her roommate, she was able to calm down and stop crying.  She denies SI/HI/AVH at this time.  She tells Probation officer that she needs a "breathing Tx" at bedtime, but when writer checks the [facility], pt only has an Albuterol inhaler which pt says is her "treatment".  This was given to her along with Tessalon perles for her cough as she has been recently tx for pneumonia.  Pt was encouraged to make her needs known to staff.  She was medicated per orders.  Support and encouragement offered.  Discharge plans are in process.  Safety maintained with q15 minute checks.

## 2018-02-09 NOTE — Plan of Care (Signed)
  Problem: Education: Goal: Knowledge of [facility] Education information/materials will improve Outcome: Progressing Goal: Emotional status will improve Outcome: Progressing Goal: Mental status will improve Outcome: Progressing   

## 2018-02-09 NOTE — BHH Group Notes (Addendum)
LCSW Group Therapy Note  02/09/2018   10:00-11:00am   Type of Therapy and Topic:  Group Therapy: Anger Cues and Responses  Participation Level:  Active   Description of Group:   In this group, patients learned how to recognize the physical, cognitive, emotional, and behavioral responses they have to anger-provoking situations.  They identified a recent time they became angry and how they reacted.  They analyzed how their reaction was possibly beneficial and how it was possibly unhelpful.  The group discussed a variety of healthier coping skills that could help with such a situation in the future.  Deep breathing was practiced briefly.  Therapeutic Goals: 1. Patients will remember their last incident of anger and how they felt emotionally and physically, what their thoughts were at the time, and how they behaved. 2. Patients will identify how their behavior at that time worked for them, as well as how it worked against them. 3. Patients will explore possible new behaviors to use in future anger situations. 4. Patients will learn that anger itself is normal and cannot be eliminated, and that healthier reactions can assist with resolving conflict rather than worsening situations.  Summary of Patient Progress:  The patient shared that her most recent time of anger was she argued with parents because her over a bed.She stated that she became very angry and attempted to take pills in a suicide attempt. The patient recognizes that she did not handle this situation the best way but felt cornered by her parents. She was able to verbalized that perhaps if she had simply accepted that she would have to sleep on the couch things would not have gotten to level they did. Patient was provided with information and knowledge that there are emotional and physical responses associated with anger. Patient understands that anger is a normal and natural human response. Patient is able to identify coping skills that they  can readily access and use to mitigate feelings of anger.  Therapeutic Modalities:   Cognitive Behavioral Therapy  Rolanda Jay, LCSW    Rolanda Jay

## 2018-02-10 MED ORDER — BENZONATATE 100 MG PO CAPS: 100.0000 mg | ORAL_CAPSULE | Freq: Three times a day (TID) | ORAL | 0 refills | Status: DC | PRN

## 2018-02-10 MED ORDER — PREDNISONE 5 MG PO TABS: 10.0000 mg | ORAL_TABLET | Freq: Every day | ORAL | 0 refills | Status: DC

## 2018-02-10 MED ORDER — SERTRALINE HCL 50 MG PO TABS: 50.0000 mg | ORAL_TABLET | Freq: Every day | ORAL | 0 refills | Status: AC

## 2018-02-10 MED ORDER — PREDNISONE 20 MG PO TABS
20.0000 mg | ORAL_TABLET | Freq: Every day | ORAL | Status: DC
  Filled 2018-02-10 (×2): qty 1

## 2018-02-10 MED ORDER — TRAZODONE HCL 50 MG PO TABS: 50.0000 mg | ORAL_TABLET | Freq: Every evening | ORAL | 0 refills | Status: DC | PRN

## 2018-02-10 MED ORDER — ALBUTEROL SULFATE HFA 108 (90 BASE) MCG/ACT IN AERS: 1.0000 | INHALATION_SPRAY | Freq: Four times a day (QID) | RESPIRATORY_TRACT | 0 refills | Status: AC | PRN

## 2018-02-10 MED ORDER — HYDROXYZINE HCL 25 MG PO TABS: 25.0000 mg | ORAL_TABLET | Freq: Four times a day (QID) | ORAL | 0 refills | Status: AC | PRN

## 2018-02-10 NOTE — Progress Notes (Signed)
Pt is prepared for dc by this Probation officer. She completed her daily assessment and on this she wrote she denied SI today and she rated her depression, hopeless and anxiety " 0/0/2", respectively. Heer dc instructions are rveiwed with her by this Probation officer, she verbalized understanding and willingness to comply and she is able to verbalize how she feels emotionally healthier now ( " I know I need help no..  I will make myself ask for it.Marland Kitchenaortic stenosis well as keep my cou7nselor appts". She is given cc of dc instructions ( SRA< AVS< SSP and transition recoord). All belongings in her locker were returned to her and she was escorted to bldg entrance and dc'd per MD order.

## 2018-02-10 NOTE — BHH Suicide Risk Assessment (Signed)
[facility] Discharge Suicide Risk Assessment   Principal Problem: Depression Discharge Diagnoses:  Patient Active Problem List   Diagnosis Date Noted  . MDD (major depressive disorder), recurrent episode, severe ([place]) [F33.2] 02/08/2018    Total Time spent with patient: 30 minutes  Musculoskeletal: Strength & Muscle Tone: within normal limits Gait & Station: normal Patient leans: N/A  Psychiatric Specialty Exam: ROS no headache, no chest pain,  No dyspnea, reports coughing has improved significantly , reports decreased/improving L shoulder discomfort/pain, for which she has been using heat pack.  Blood pressure 125/75, pulse (!) 104, temperature 99.9 F (37.7 C), temperature source Oral, resp. rate 18, height 5\' 7"  (1.702 m), weight 84.4 kg, SpO2 99 %.Body mass index is 29.13 kg/m.  General Appearance: Well Groomed  Eye Contact::  Good  Speech:  Normal Rate409  Volume:  Normal  Mood:  Euthymic- denies feeling depressed, states " this is the best I have felt in a while"  Affect:  appropriate, reactive   Thought Process:  Linear and Descriptions of Associations: Intact  Orientation:  Full (Time, Place, and Person)  Thought Content:  no hallucinations, no delusions, not internally preoccupied   Suicidal Thoughts:  No denies suicidal or self injurious ideations, denies homicidal ideations  Homicidal Thoughts:  No  Memory:  recent and remote grossly intact   Judgement:  Other:  improving   Insight:  improving   Psychomotor Activity:  Normal  Concentration:  Good  Recall:  Good  Fund of Knowledge:Good  Language: Good  Akathisia:  Negative  Handed:  Left  AIMS (if indicated):     Assets:  Desire for Improvement Resilience  Sleep:  Number of Hours: 3.25  Cognition: WNL  ADL's:  Intact   Mental Status Per Nursing Assessment::   On Admission:  Suicidal ideation indicated by patient, Self-harm thoughts  Demographic Factors:  22 year old single , no children, lives with parents,  employed, in college   Loss Factors: L shoulder injury from horseback riding accident required surgeries x 2, and subsequent loss of ability to function at premorbid level and being independent in daily activities   Historical Factors: No prior psychiatric admissions, no prior suicide attempts, history of depression  Risk Reduction Factors:   Sense of responsibility to family, Employed, Living with another person, especially a relative, Positive social support and Positive coping skills or problem solving skills  Continued Clinical Symptoms:  At this time patient is alert, attentive, well related , pleasant on approach, calm, mood improved and currently euthymic, with full range and appropriate range of affect, no thought disorder, no suicidal or self injurious ideations, no homicidal or violent ideations , no hallucinations, future oriented . Behavior on unit calm and in good control, pleasant on approach. Denies medication side effects , we reviewed side effects, including risk of increased suicidal ideations early in treatment with antidepressants in young adults . Patient had been diagnosed with pneumonia recently- reports she is feeling " a lot better" physically as well, and  no dyspnea, improved cough, no fever, no chills . Has completed Keflex course and is completing Prednisone taper .   Cognitive Features That Contribute To Risk:  No gross cognitive deficits noted upon discharge. Is alert , attentive, and oriented x 3   Suicide Risk:  Mild:  Suicidal ideation of limited frequency, intensity, duration, and specificity.  There are no identifiable plans, no associated intent, mild dysphoria and related symptoms, good self-control (both objective and subjective assessment), few other risk factors,  and identifiable protective factors, including available and accessible social support.    Plan Of Care/Follow-up recommendations:  Activity:  as tolerated  Diet:  regular Tests:   NA Other:  see below  Patient is requesting discharge, has submitted letter requesting discharge- there are no current grounds for involuntary commitment. Leaving unit in good spirits, plans to return home, states her parents will pick her up later on today. Plans to follow up with her PCP for ongoing medical management at [place], MD 02/10/2018, 8:41 AM

## 2018-02-10 NOTE — Discharge Summary (Addendum)
Physician Discharge Summary Note  Patient:  Dawn Hartman is an 22 y.o., female MRN:  [id number] DOB:  [birth year] Patient phone:  [phone] (home)  Patient address:   [place] [address],  Total Time spent with patient: 20 minutes  Date of Admission:  02/08/2018 Date of Discharge: 02/10/18  Reason for Admission:  Worsening depression with SI  Principal Problem: MDD (major depressive disorder), recurrent episode, severe [facility]) Discharge Diagnoses: Patient Active Problem List   Diagnosis Date Noted  . MDD (major depressive disorder), recurrent episode, severe ([place]) [F33.2] 02/08/2018    Past Psychiatric History: no prior psychiatric admissions, denies history of suicidal attempts, reports history of self cutting which started at about 22 years of age, last cut about 3-4 years ago. Denies history of psychosis. Reports history of prior depressive episode at age 39 following death of a friend. Denies history of mania, hypomania. Denies history of violence .  Past Medical History:  Past Medical History:  Diagnosis Date  . Acromioclavicular joint separation 12/08/2017  . Anemia   . Asthma    Interm  . Breast mass, right 03/2012  . Complication of anesthesia   . Dislocation of left shoulder joint    AC seperation  . Family history of adverse reaction to anesthesia    mother vomits while under anesthesia  . Headache   . High triglycerides    takes fish oil supplement  . History of epistaxis    2-3 hours  . History of MRSA infection age 53   finger  . Ileitis 01/15/2014  . 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 Ovarian cyst   . PONV (postoperative nausea and vomiting)   . Vasovagal episode    during nosebleed    Past Surgical History:  Procedure Laterality Date  . ADENOIDECTOMY    . BREAST LUMPECTOMY WITH NEEDLE LOCALIZATION  04/15/2012   Procedure: BREAST LUMPECTOMY WITH NEEDLE LOCALIZATION;  Surgeon: Rolm Bookbinder, MD;  Location: [facility];  Service: General;  Laterality: Right;  right breast wire localized mass excision  . BREAST SURGERY    . INCISION AND DRAINAGE Left 01/22/2018   Procedure: INCISION AND DRAINAGE LEFT SHOULDER;  Surgeon: [name], MD;  Location: [facility];  Service: Orthopedics;  Laterality: Left;  . MYRINGOTOMY  04/1999; 04/2002  . NASAL CAUTERIZATION  Bilateral   . RECONSTRUCTION OF CORACOCLAVICULAR LIGAMENT Left 12/18/2017   Procedure: RECONSTRUCTION OF CORACOCLAVICULAR LIGAMENT LEFT SHOULDER;  Surgeon: [name], MD;  Location: [facility];  Service: Orthopedics;  Laterality: Left;  . TONSILLECTOMY  03/2010   Family History:  Family History  Problem Relation Age of Onset  . Heart disease Paternal Grandfather        atrial fib.  . Asthma Paternal Grandfather        as a child  . Diabetes Maternal Grandmother   . Hypertension Maternal Grandmother   . Leukemia Maternal Grandfather   . Cancer Paternal Grandmother        breast CA  . Anesthesia problems Mother        post-op N/V  . Asthma Sister    Family Psychiatric  History: reports mother has history of anxiety, and a great aunt has a serious mental illness,but she is unsure which, no suicides in family, no alcohol or drug abuse in family Social History:  Social History   Substance and Sexual Activity  Alcohol Use No     Social History   Substance and  Sexual Activity  Drug Use No    Social History   Socioeconomic History  . Marital status: Single    Spouse name: Not on file  . Number of children: Not on file  . Years of education: Not on file  . Highest education level: Not on file  Occupational History  . Not on file  Social Needs  . Financial resource strain: Not on file  . Food insecurity:    Worry: Not on file    Inability: Not on file  . Transportation needs:    Medical: Not on file    Non-medical: Not on file  Tobacco Use  . Smoking status: Never Smoker  .  Smokeless tobacco: Never Used  Substance and Sexual Activity  . Alcohol use: No  . Drug use: No  . Sexual activity: Never    Birth control/protection: Abstinence  Lifestyle  . Physical activity:    Days per week: Not on file    Minutes per session: Not on file  . Stress: Not on file  Relationships  . Social connections:    Talks on phone: Not on file    Gets together: Not on file    Attends religious service: Not on file    Active member of club or organization: Not on file    Attends meetings of clubs or organizations: Not on file    Relationship status: Not on file  Other Topics Concern  . Not on file  Social History Narrative  . Not on file    Hospital Course:   02/08/18 [facility] MD Assessment: 22 year old female, lives with parents, presented to [place] ED with family yesterday. Reports she impulsively attempted to ingest some Hydrocodone tablets  ( which were from a prior prescription- states she has not been taking any opiates recently), but that family prevented her from actually ingesting them. She states this occurred in the context of a family argument, feeling that her parents, sister were angry with her, yelling . She had a surgery on 8/27 for a severe acromioclavicular dislocation which occurred during a horse riding accident. States she has been more dependent on family for activities of daily living such as dressing , moving around due to this injury. Reports that she has a history of depression, but feels that it has worsened since her accident, and related to being more dependent on others , " like I feel like I am in the way, like I have become a burden". States " my depression comes and goes ". She denies prior suicidal ideations and states above overdose attempt was unplanned and impulsive . Endorses neuro-vegetative symptoms as below, which she states have tended to be variable in severity  from one day to the next.   Patient remained on the [facility] unit for 2 days. The  patient stabilized on medication and therapy. Patient was discharged on Zoloft 50 mg Daily, Vistaril 25 mg Q6H PRN, Trazodone 50 mg QHS PRN. Patient has shown improvement with improved mood, affect, sleep, appetite, and interaction. Patient has attended group and participated. Patient has been seen in the day room interacting with peers and staff appropriately. Patient denies any SI/HI/AVH and contracts for safety. Patient agrees to follow up at [facility]. Patient is provided with prescriptions for their medications upon discharge.   Physical Findings: AIMS: Facial and Oral Movements Muscles of Facial Expression: None, normal Lips and Perioral Area: None, normal Jaw: None, normal Tongue: None, normal,Extremity Movements Upper (arms, wrists, hands, fingers):  None, normal Lower (legs, knees, ankles, toes): None, normal, Trunk Movements Neck, shoulders, hips: None, normal, Overall Severity Severity of abnormal movements (highest score from questions above): None, normal Incapacitation due to abnormal movements: None, normal Patient's awareness of abnormal movements (rate only patient's report): No Awareness, Dental Status Current problems with teeth and/or dentures?: No Does patient usually wear dentures?: No  CIWA:    COWS:     Musculoskeletal: Strength & Muscle Tone: within normal limits Gait & Station: normal Patient leans: N/A  Psychiatric Specialty Exam: Physical Exam  Nursing note and vitals reviewed. Constitutional: She is oriented to person, place, and time. She appears well-developed and well-nourished.  Respiratory: Effort normal.  Musculoskeletal: Normal range of motion.  Neurological: She is alert and oriented to person, place, and time.  Skin: Skin is warm.    Review of Systems  Constitutional: Negative.   HENT: Negative.   Eyes: Negative.   Respiratory: Negative.   Cardiovascular: Negative.   Gastrointestinal: Negative.   Genitourinary: Negative.    Musculoskeletal: Negative.   Skin: Negative.   Neurological: Negative.   Endo/Heme/Allergies: Negative.   Psychiatric/Behavioral: Negative.     Blood pressure 125/75, pulse (!) 104, temperature 99.9 F (37.7 C), temperature source Oral, resp. rate 18, height 5\' 7"  (1.702 m), weight 84.4 kg, SpO2 99 %.Body mass index is 29.13 kg/m.  General Appearance: Casual  Eye Contact:  Good  Speech:  Clear and Coherent and Normal Rate  Volume:  Normal  Mood:  Euthymic  Affect:  Congruent  Thought Process:  Goal Directed and Descriptions of Associations: Intact  Orientation:  Full (Time, Place, and Person)  Thought Content:  WDL  Suicidal Thoughts:  No  Homicidal Thoughts:  No  Memory:  Immediate;   Good Recent;   Good Remote;   Good  Judgement:  Fair  Insight:  Fair  Psychomotor Activity:  Normal  Concentration:  Concentration: Good and Attention Span: Good  Recall:  Good  Fund of Knowledge:  Good  Language:  Good  Akathisia:  No  Handed:  Right  AIMS (if indicated):     Assets:  Communication Skills Desire for Improvement Financial Resources/Insurance Housing Physical Health Social Support Transportation  ADL's:  Intact  Cognition:  WNL  Sleep:  Number of Hours: 3.25     Have you used any form of tobacco in the last 30 days? (Cigarettes, Smokeless Tobacco, Cigars, and/or Pipes): No  Has this patient used any form of tobacco in the last 30 days? (Cigarettes, Smokeless Tobacco, Cigars, and/or Pipes) Yes, No  Blood Alcohol level:  Lab Results  Component Value Date   ETH <10 76/19/5093    Metabolic Disorder Labs:  No results found for: HGBA1C, MPG No results found for: PROLACTIN No results found for: CHOL, TRIG, HDL, CHOLHDL, VLDL, LDLCALC  See Psychiatric Specialty Exam and Suicide Risk Assessment completed by Attending Physician prior to discharge.  Discharge destination:  Home  Is patient on multiple antipsychotic therapies at discharge:  No   Has Patient had  three or more failed trials of antipsychotic monotherapy by history:  No  Recommended Plan for Multiple Antipsychotic Therapies: NA   Allergies as of 02/10/2018      Reactions   Adhesive [tape]    Paper tape only   Latex Rash      Medication List    STOP taking these medications   cephALEXin 500 MG capsule Commonly known as:  KEFLEX   doxycycline 50 MG capsule Commonly known as:  VIBRAMYCIN   ibuprofen 400 MG tablet Commonly known as:  ADVIL,MOTRIN   IRON (FERROUS SULFATE) PO   ondansetron 4 MG tablet Commonly known as:  ZOFRAN     TAKE these medications     Indication  albuterol 108 (90 Base) MCG/ACT inhaler Commonly known as:  PROVENTIL HFA;VENTOLIN HFA Inhale 1-2 puffs into the lungs every 6 (six) hours as needed for wheezing or shortness of breath.  Indication:  Asthma   benzonatate 100 MG capsule Commonly known as:  TESSALON Take 1 capsule (100 mg total) by mouth 3 (three) times daily as needed for cough.  Indication:  Cough   hydrOXYzine 25 MG tablet Commonly known as:  ATARAX/VISTARIL Take 1 tablet (25 mg total) by mouth every 6 (six) hours as needed for anxiety.  Indication:  Feeling Anxious   predniSONE 5 MG tablet Commonly known as:  DELTASONE Take 2 tablets (10 mg total) by mouth daily with breakfast. Take 4 tabs tomorrow, then 2 tabs the next day, then 1 tab the next day, then stop Start taking on:  02/11/2018 What changed:    medication strength  additional instructions  Indication:  Inflammation   sertraline 50 MG tablet Commonly known as:  ZOLOFT Take 1 tablet (50 mg total) by mouth daily. For mood control Start taking on:  02/11/2018  Indication:  mood stability   traZODone 50 MG tablet Commonly known as:  DESYREL Take 1 tablet (50 mg total) by mouth at bedtime as needed for sleep.  Indication:  [facility] Follow up.   Why:  A hospital social worker will make an  appointment for follow-up and call you Monday 02/11/18 with that information (at [phone]).  If you don't hear by Tuesday, please call your doctor's office directly. Contact information: [place] [address] [phone]        Therapy Follow up.   Why:  You have been provided a list of 4 therapists in your area.  3 of them take your insurance and 1 is self-pay.  Please make a choice and call them for an initial consult.          Follow-up recommendations:  Continue activity as tolerated. Continue diet as recommended by your PCP. Ensure to keep all appointments with outpatient providers.  Comments:  Patient is instructed prior to discharge to: Take all medications as prescribed by his/her mental healthcare provider. Report any adverse effects and or reactions from the medicines to his/her outpatient provider promptly. Patient has been instructed & cautioned: To not engage in alcohol and or illegal drug use while on prescription medicines. In the event of worsening symptoms, patient is instructed to call the crisis hotline, 911 and or go to the nearest ED for appropriate evaluation and treatment of symptoms. To follow-up with his/her primary care provider for your other medical issues, concerns and or health care needs.    Signed: [place], FNP 02/10/2018, 2:59 PM   Patient seen, Suicide Assessment Completed.  Disposition Plan Reviewed

## 2018-02-10 NOTE — Progress Notes (Signed)
  [facility] Adult Case Management Discharge Plan :  Will you be returning to the same living situation after discharge:  Yes,  home with parents At discharge, do you have transportation home?: Yes,  parents Do you have the ability to pay for your medications: Yes,  insurance  Release of information consent forms completed and turned in to Medical Records by CSW.   Patient to Follow up at: Follow-up Information    [facility] Follow up.   Why:  A hospital social worker will make an appointment for follow-up and call you Monday 02/11/18 with that information (at [phone]).  If you don't hear by Tuesday, please call your doctor's office directly. Contact information: [place] [address] [phone]        Therapy Follow up.   Why:  You have been provided a list of 4 therapists in your area.  3 of them take your insurance and 1 is self-pay.  Please make a choice and call them for an initial consult.          Next level of care provider has access to [place] and Suicide Prevention discussed: Yes,  with mother  Have you used any form of tobacco in the last 30 days? (Cigarettes, Smokeless Tobacco, Cigars, and/or Pipes): No  Has patient been referred to the Quitline?: N/A patient is not a smoker  Patient has been referred for addiction treatment: N/A  Maretta Los, LCSW 02/10/2018, 1:40 PM

## 2018-02-10 NOTE — BHH Group Notes (Signed)
[facility] LCSW Group Therapy Note  Date/Time:  02/10/2018 10:00-11:00AM  Type of Therapy and Topic:  Group Therapy:  Healthy and Unhealthy Supports  Participation Level:  Active   Description of Group:  Patients in this group were introduced to the idea of adding a variety of healthy supports to address the various needs in their lives.Patients discussed what additional healthy supports could be helpful in their recovery and wellness after discharge in order to prevent future hospitalizations.   An emphasis was placed on using counselor, doctor, therapy groups, 12-step groups, and problem-specific support groups to expand supports.  They also worked as a group on developing a specific plan for several patients to deal with unhealthy supports through [place], psychoeducation with loved ones, and even termination of relationships.   Therapeutic Goals:   1)  discuss importance of adding supports to stay well once out of the hospital  2)  compare healthy versus unhealthy supports and identify some examples of each  3)  generate ideas and descriptions of healthy supports that can be added  4)  offer mutual support about how to address unhealthy supports  5)  encourage active participation in and adherence to discharge plan    Summary of Patient Progress:  The patient stated that current healthy supports in her life are her family while current unhealthy supports include an aunt who tries to persuade her to stop taking her  Medication and to instead use natural remedies. She described listening to her and eventually needing to come to the hospital.  The patient expressed a willingness to limit contact with individuals who increased her stress and anxiety  And to add therapy and work with prescriber as supports to help in her recovery journey.   Therapeutic Modalities:   Motivational Interviewing Brief Solution-Focused Therapy  Rolanda Jay, LCSW  Rolanda Jay

## 2018-02-10 NOTE — BHH Suicide Risk Assessment (Signed)
Dawn Hartman INPATIENT:  Family/Significant Other Suicide Prevention Education  Suicide Prevention Education:  Education Completed; mother Dawn Hartman [phone] has been identified by the patient as the family member/significant other with whom the patient will be residing, and identified as the person(s) who will aid the patient in the event of a mental health crisis (suicidal ideations/suicide attempt).  With written consent from the patient, the family member/significant other has been provided the following suicide prevention education, prior to the and/or following the discharge of the patient.  MOTHER STATES ALL GUNS ARE LOCKED UP IN A GUN SAFE.  PARENTS HAVE BEEN VISITING AND FEEL PT IS DOING WELL ENOUGH TO BE DISCHARGED.  SHE ASKED WHETHER IT WOULD BE ADVISABLE TO KEEP ALL MEDICATIONS LOCKED UP AT THE HOME AND CSW STATED THIS WOULD BE A HEALTHY PRECAUTION AND THAT PT SHOULD BE INFORMED OF WHY THIS PRECAUTION IS BEING TAKEN.  The suicide prevention education provided includes the following:  Suicide risk factors  Suicide prevention and interventions  National Suicide Hotline telephone number  [facility] assessment telephone number  [facility] Emergency Assistance [place] and/or Residential Mobile Crisis Unit telephone number  Request made of family/significant other to:  Remove weapons (e.g., guns, rifles, knives), all items previously/currently identified as safety concern.    Remove drugs/medications (over-the-counter, prescriptions, illicit drugs), all items previously/currently identified as a safety concern.  The family member/significant other verbalizes understanding of the suicide prevention education information provided.  The family member/significant other agrees to remove the items of safety concern listed above.  Dawn Hartman 02/10/2018, 10:00 AM

## 2018-02-13 ENCOUNTER — Telehealth: Payer: Self-pay | Admitting: Internal Medicine

## 2018-02-13 NOTE — Telephone Encounter (Signed)
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  Patient last had surgery on 10/1. She was given a 21d course of doxy/cephalexin.  She is now off of abtx but he is still concerned that the region may have indolent infection  The affected area is superficial to original surgery (secured 2 metal buttons and autologous-tendon wrap)  Patient is to see Dr Linus Salmons on 10/24 for assessment and possible extention of oral abtx for p.acnes infection

## 2018-02-13 NOTE — Telephone Encounter (Signed)
Per call from [facility] called the patient to inform of office visit and give directions. She advised she will be able to make the appt.

## 2018-02-14 ENCOUNTER — Ambulatory Visit (INDEPENDENT_AMBULATORY_CARE_PROVIDER_SITE_OTHER): Payer: BLUE CROSS/BLUE SHIELD | Admitting: Internal Medicine

## 2018-02-14 ENCOUNTER — Encounter: Payer: Self-pay | Admitting: Internal Medicine

## 2018-02-14 DIAGNOSIS — T8149XA Infection following a procedure, other surgical site, initial encounter: Secondary | ICD-10-CM

## 2018-02-14 MED ORDER — FLUCONAZOLE 150 MG PO TABS: 150.0000 mg | ORAL_TABLET | Freq: Every day | ORAL | 1 refills | Status: DC

## 2018-02-14 MED ORDER — DOXYCYCLINE HYCLATE 100 MG PO TABS: 100.0000 mg | ORAL_TABLET | Freq: Two times a day (BID) | ORAL | 0 refills | Status: DC

## 2018-02-15 NOTE — Progress Notes (Signed)
[facility] for Infectious Disease      Reason for Consult: superficial shoulder infection    Referring Physician: Dr. Alain Marion    Patient ID: Dawn Hartman, female    DOB: 17-Mar-1996, 22 y.o.   MRN: [id number]  HPI:   Here at the request of Dr. Percell Miller.  She sustained an AC separation after a fall off of a horse on to her left shoulder and required surgery which was done on 12/18/17.  She initially did well but developed some pus from the incision site and dehiscence and went back to see Dr. Percell Miller and c/w skin reaction to stitch.  She underwent debridement on 10/1 and found serous fluid and intact repair of the deep layer from the initial surgery.  She was placed on doxycycline and keflex and later the culture came back with P acnes.  Seen in the ortho office and culture discussed and sent for ID evaluation.  No pus noted in OR report.   Previous record reviewed Epic including OP report.    Past Medical History:  Diagnosis Date  . Acromioclavicular joint separation 12/08/2017  . Anemia   . Asthma    Interm  . Breast mass, right 03/2012  . Complication of anesthesia   . Dislocation of left shoulder joint    AC seperation  . Family history of adverse reaction to anesthesia    mother vomits while under anesthesia  . Headache   . High triglycerides    takes fish oil supplement  . History of epistaxis    2-3 hours  . History of MRSA infection age 28   finger  . Ileitis 01/15/2014  . 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 Ovarian cyst   . PONV (postoperative nausea and vomiting)   . Vasovagal episode    during nosebleed    Prior to Admission medications   Medication Sig Start Date End Date Taking? Authorizing Provider  albuterol (PROVENTIL HFA;VENTOLIN HFA) 108 (90 Base) MCG/ACT inhaler Inhale 1-2 puffs into the lungs every 6 (six) hours as needed for wheezing or shortness of breath. 02/10/18  Yes Money, Lowry Ram, FNP  benzonatate (TESSALON) 100 MG capsule Take 1 capsule (100  mg total) by mouth 3 (three) times daily as needed for cough. 02/10/18  Yes Money, Lowry Ram, FNP  hydrOXYzine (ATARAX/VISTARIL) 25 MG tablet Take 1 tablet (25 mg total) by mouth every 6 (six) hours as needed for anxiety. 02/10/18  Yes Money, Lowry Ram, FNP  sertraline (ZOLOFT) 50 MG tablet Take 1 tablet (50 mg total) by mouth daily. For mood control 02/11/18  Yes Money, Lowry Ram, FNP  traZODone (DESYREL) 50 MG tablet Take 1 tablet (50 mg total) by mouth at bedtime as needed for sleep. 02/10/18  Yes Money, Lowry Ram, FNP  doxycycline (VIBRA-TABS) 100 MG tablet Take 1 tablet (100 mg total) by mouth 2 (two) times daily. 02/14/18   Thayer Headings, MD  fluconazole (DIFLUCAN) 150 MG tablet Take 1 tablet (150 mg total) by mouth daily. 02/14/18   Kailer Heindel, Okey Regal, MD  predniSONE (DELTASONE) 5 MG tablet Take 2 tablets (10 mg total) by mouth daily with breakfast. Take 4 tabs tomorrow, then 2 tabs the next day, then 1 tab the next day, then stop Patient not taking: Reported on 02/14/2018 02/11/18   Money, Lowry Ram, FNP    Allergies  Allergen Reactions  . Adhesive [Tape]     Paper tape only  . Latex Rash    Social History  Tobacco Use  . Smoking status: Never Smoker  . Smokeless tobacco: Never Used  Substance Use Topics  . Alcohol use: No  . Drug use: No    Family History  Problem Relation Age of Onset  . Heart disease Paternal Grandfather        atrial fib.  . Asthma Paternal Grandfather        as a child  . Diabetes Maternal Grandmother   . Hypertension Maternal Grandmother   . Leukemia Maternal Grandfather   . Cancer Paternal Grandmother        breast CA  . Anesthesia problems Mother        post-op N/V  . Asthma Sister     Review of Systems  Constitutional: negative for fevers and chills Gastrointestinal: negative for diarrhea Genitourinary: positive for vaginal discharge with the doxycycline earlier this month Integument/breast: negative for rash Musculoskeletal: positive for  arthralgias All other systems reviewed and are negative    Constitutional: in no apparent distress and alert  Vitals:   02/14/18 1131  BP: 114/83  Pulse: 80  Temp: 99.3 F (37.4 C)   EYES: anicteric ENMT: no thrush Cardiovascular: Cor RRR Respiratory: CTA B; normal respiratory effort Musculoskeletal: shoulder is mildly warm compared to other shoulder, tender, no erythema, incision closed Skin: negatives: no rash Hematologic: no cervical lad  Labs: Lab Results  Component Value Date   WBC 6.6 02/09/2018   HGB 12.8 02/09/2018   HCT 41.1 02/09/2018   MCV 90.5 02/09/2018   PLT 307 02/09/2018    Lab Results  Component Value Date   CREATININE 0.70 02/09/2018   BUN 14 02/09/2018   NA 140 02/09/2018   K 3.6 02/09/2018   CL 104 02/09/2018   CO2 29 02/09/2018    Lab Results  Component Value Date   ALT 25 11/06/2017   AST 19 11/06/2017   ALKPHOS 55 11/06/2017   BILITOT 0.7 11/06/2017     Assessment:P acnes infected seroma, superficial.  Some warmth in the area, tenderness so I will have her continue with doxycycline for 2-3 weeks and reevaluate then.  Nothing deep.     Plan: 1) doxycyline  2) fluconazole as needed for yeast infection 3) recommended sun block for sun exposure Reevaluate shoulder in 2-3 weeks

## 2018-03-04 ENCOUNTER — Telehealth: Payer: Self-pay

## 2018-03-04 ENCOUNTER — Other Ambulatory Visit: Payer: Self-pay | Admitting: Internal Medicine

## 2018-03-04 NOTE — Telephone Encounter (Signed)
Received refill request for doxycycline 100 mg. Per last note patient is to start Doxycycline and follow-up In three weeks. Patient is scheduled for 11/20. Will ask Dr. Linus Salmons if it is okay to refill medication. Hurricane

## 2018-03-04 NOTE — Telephone Encounter (Signed)
Right, she should take it until her follow up with me, so ok to refill. thanks

## 2018-03-13 ENCOUNTER — Ambulatory Visit (INDEPENDENT_AMBULATORY_CARE_PROVIDER_SITE_OTHER): Payer: BLUE CROSS/BLUE SHIELD | Admitting: Internal Medicine

## 2018-03-13 ENCOUNTER — Encounter: Payer: Self-pay | Admitting: Internal Medicine

## 2018-03-13 VITALS — BP 121/79 | HR 88 | Temp 97.6°F | Ht 67.0 in | Wt 184.0 lb

## 2018-03-13 DIAGNOSIS — Z5181 Encounter for therapeutic drug level monitoring: Secondary | ICD-10-CM

## 2018-03-13 DIAGNOSIS — T8149XA Infection following a procedure, other surgical site, initial encounter: Secondary | ICD-10-CM

## 2018-03-13 NOTE — Assessment & Plan Note (Signed)
Doing well now and no concerns of active infection.  Feels well and now has completed treatment.  No further antibiotics indicated.  rtc PRN

## 2018-03-13 NOTE — Assessment & Plan Note (Signed)
No issues with doxycycline with sun or yeast infections.  Now off.

## 2018-03-13 NOTE — Progress Notes (Signed)
   Subjective:    Patient ID: Dawn Hartman, female    DOB: 03/20/1996, 22 y.o.   MRN: [id number]  HPI Here for follow up of post op wound infection  She sustained an AC separation after a fall off of a horse on to her left shoulder and required surgery which was done on 12/18/17.  She initially did well but developed some pus from the incision site and dehiscence and went back to see Dr. Percell Miller and c/w skin reaction to stitch.  She underwent debridement on 10/1 and found serous fluid and intact repair of the deep layer from the initial surgery.  She was placed on doxycycline and keflex and later the culture came back with P acnes.  Seen in the ortho office and culture discussed and sent for ID evaluation.  No pus noted in OR report.    She is here now for follow up after 3 weeks of doxycycline with a Proprionabacterium infection.  She is continuing with rehab and doing well.  Some soreness but no warmth or open incision now.  No associated rash, no diarrhea or yeast infections.     Review of Systems  Musculoskeletal: Negative for joint swelling.       Objective:   Physical Exam  Constitutional: She appears well-developed and well-nourished. No distress.  Musculoskeletal:  Left shoulder with closed incision, no warmth, no tenderness  Skin: No rash noted.          Assessment & Plan:

## 2018-07-02 ENCOUNTER — Ambulatory Visit: Payer: BLUE CROSS/BLUE SHIELD | Admitting: Neurology

## 2018-07-02 ENCOUNTER — Telehealth: Payer: Self-pay

## 2018-07-02 NOTE — Telephone Encounter (Signed)
Pt did not show for their appt with Dr. Athar today.  

## 2018-07-03 ENCOUNTER — Encounter: Payer: Self-pay | Admitting: Neurology

## 2018-09-21 IMAGING — DX DG CERVICAL SPINE COMPLETE 4+V
6 series · 6 of 6 positions shown · non-contrast
Comparison: 04/13/2017

CLINICAL DATA: Patient fell yesterday with neck pain. History of
clavicle surgery.

EXAM:
CERVICAL SPINE - COMPLETE 4+ VIEW

[c-spine lat]
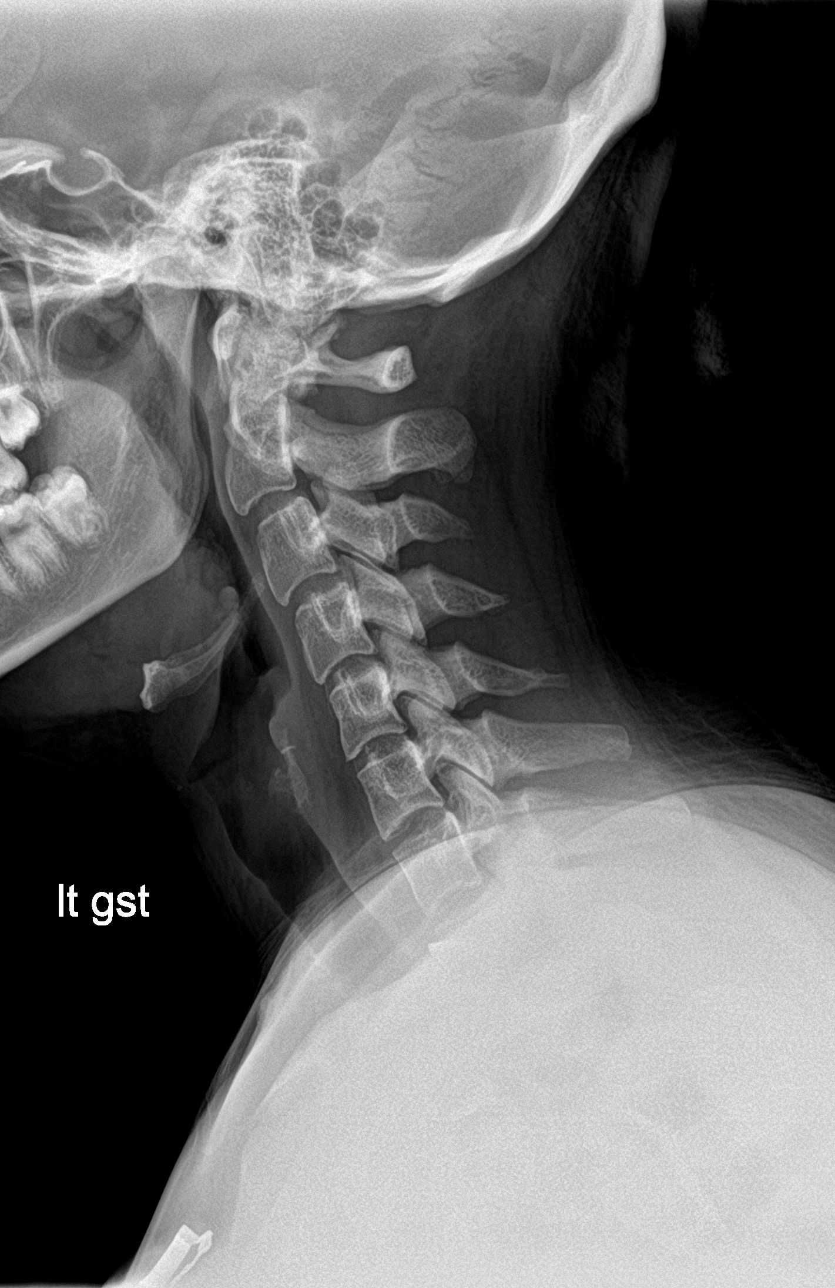

[c-spine obl (1 of 2)]
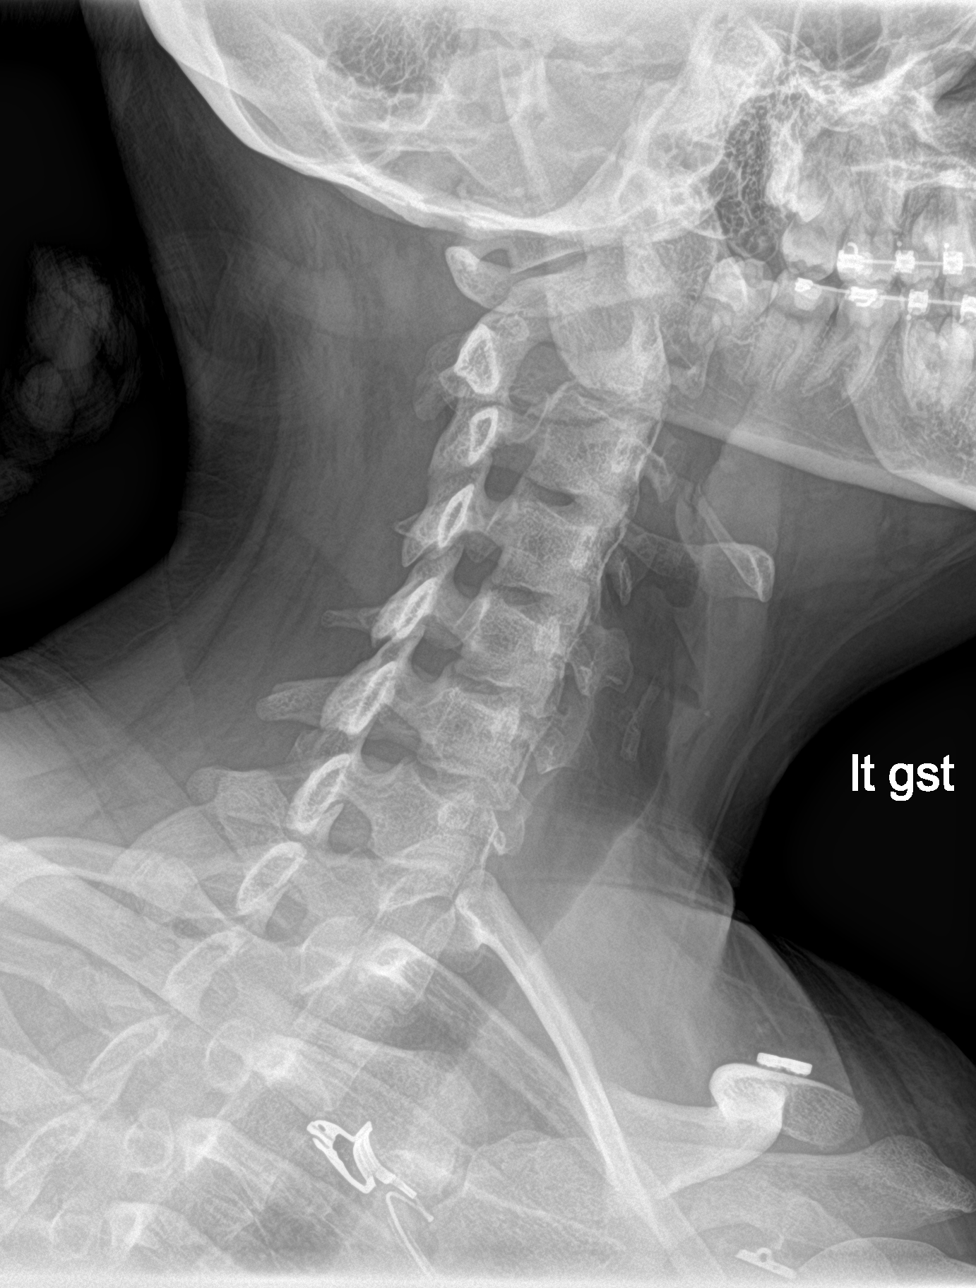

[c-spine obl (2 of 2)]
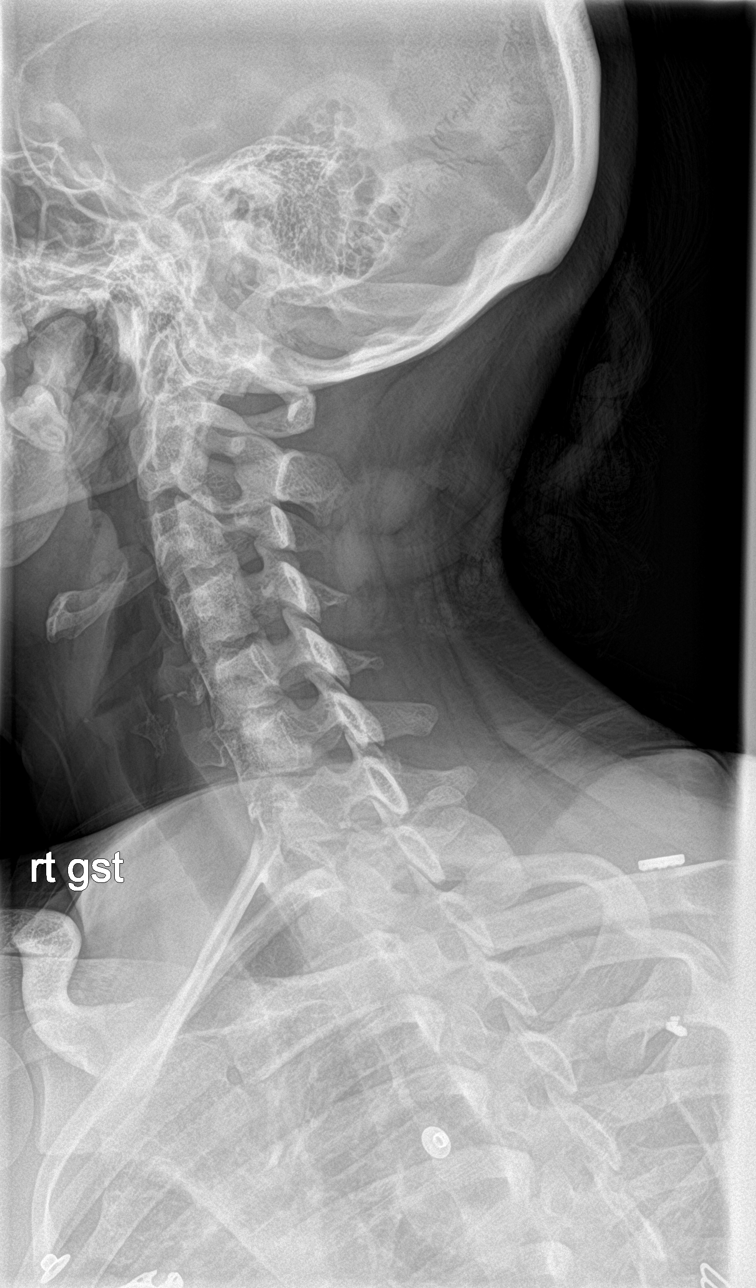

[c-spine ap (1 of 2)]
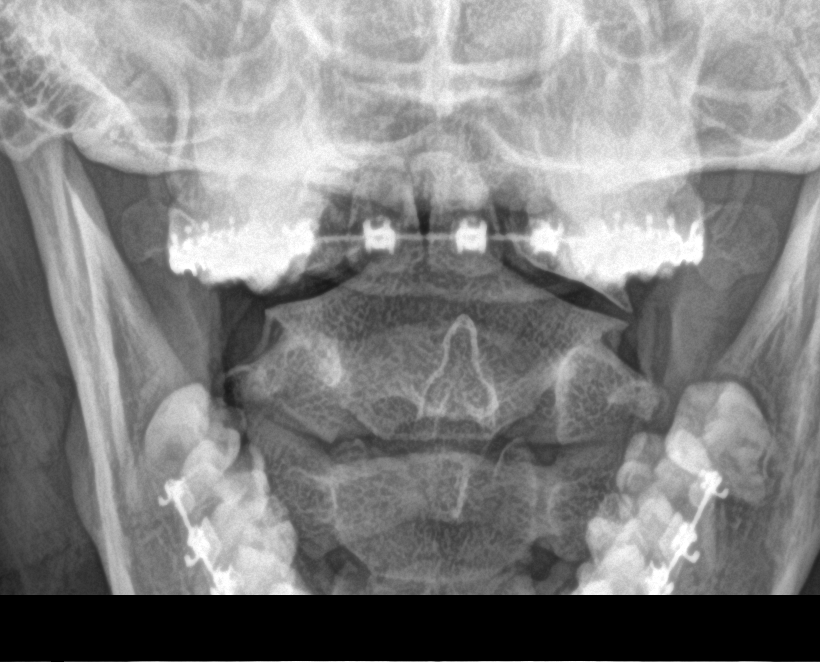

[[person_name]]
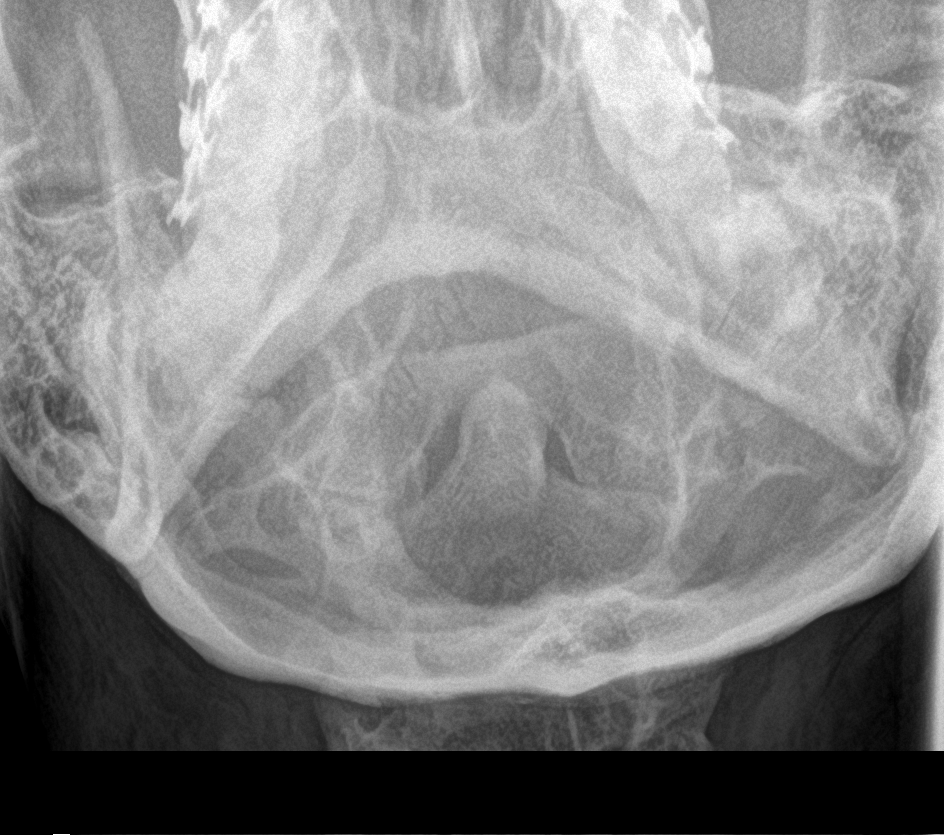

[c-spine ap (2 of 2)]
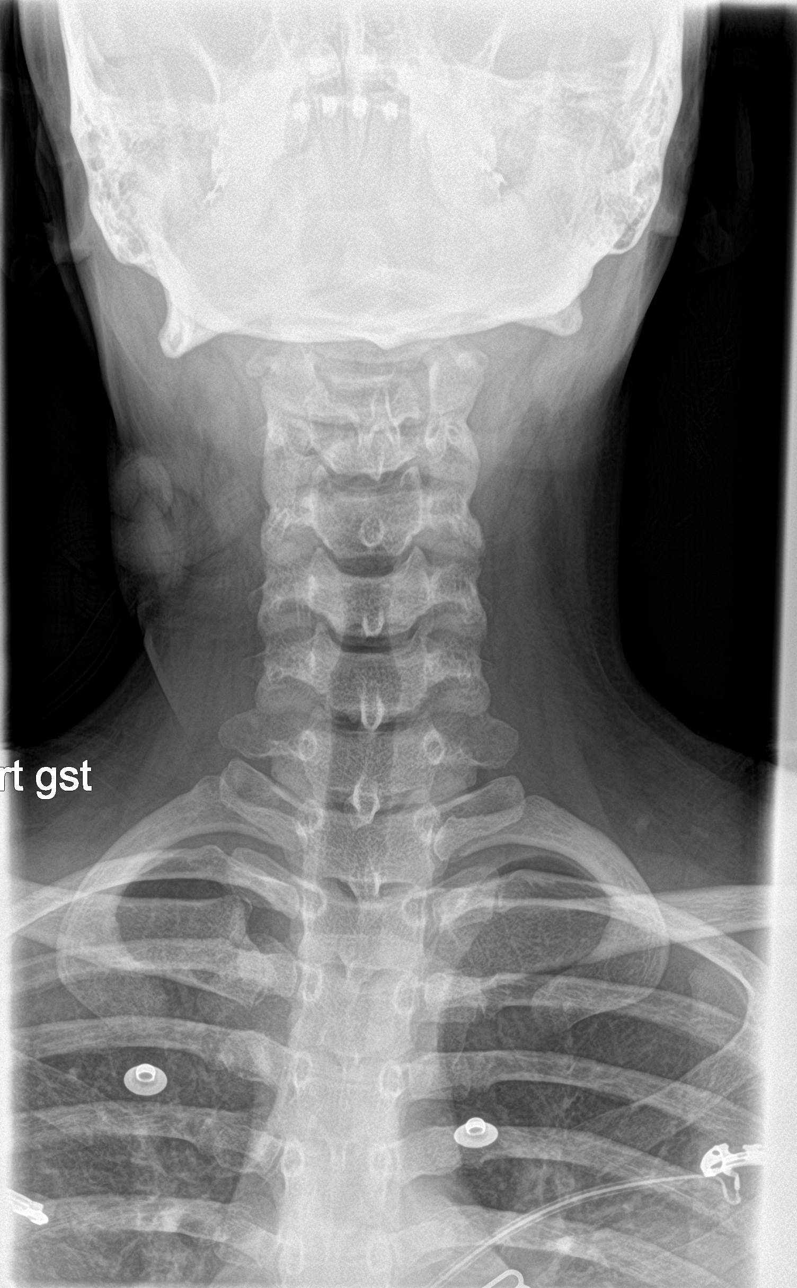

[6 of 6 positions shown; findings below may reference images not displayed]

FINDINGS: Six views of the cervical spine. Maintained cervical lordosis
without significant disc flattening. No vertebral fracture or
listhesis. No suspicious osseous lesions. There is no prevertebral
soft tissue swelling. The atlantodental is normal. No splaying of
the lateral masses of C1 on C2. Intact odontoid process.
IMPRESSION: Negative cervical spine radiographs.

## 2020-09-08 ENCOUNTER — Ambulatory Visit (INDEPENDENT_AMBULATORY_CARE_PROVIDER_SITE_OTHER): Payer: BLUE CROSS/BLUE SHIELD | Admitting: Family Medicine

## 2020-09-08 ENCOUNTER — Other Ambulatory Visit: Payer: Self-pay

## 2020-09-08 ENCOUNTER — Encounter (INDEPENDENT_AMBULATORY_CARE_PROVIDER_SITE_OTHER): Payer: Self-pay | Admitting: Family Medicine

## 2020-09-08 VITALS — BP 107/72 | HR 67 | Temp 98.2°F | Ht 66.0 in | Wt 195.0 lb

## 2020-09-08 DIAGNOSIS — R5383 Other fatigue: Secondary | ICD-10-CM | POA: Diagnosis not present

## 2020-09-08 DIAGNOSIS — F32A Depression, unspecified: Secondary | ICD-10-CM

## 2020-09-08 DIAGNOSIS — E038 Other specified hypothyroidism: Secondary | ICD-10-CM

## 2020-09-08 DIAGNOSIS — Z9189 Other specified personal risk factors, not elsewhere classified: Secondary | ICD-10-CM | POA: Diagnosis not present

## 2020-09-08 DIAGNOSIS — R0602 Shortness of breath: Secondary | ICD-10-CM

## 2020-09-08 DIAGNOSIS — Z1331 Encounter for screening for depression: Secondary | ICD-10-CM

## 2020-09-08 DIAGNOSIS — Z6831 Body mass index (BMI) 31.0-31.9, adult: Secondary | ICD-10-CM

## 2020-09-08 DIAGNOSIS — E669 Obesity, unspecified: Secondary | ICD-10-CM

## 2020-09-08 DIAGNOSIS — Z0289 Encounter for other administrative examinations: Secondary | ICD-10-CM

## 2020-09-08 DIAGNOSIS — E66811 Obesity, class 1: Secondary | ICD-10-CM

## 2020-09-08 DIAGNOSIS — F419 Anxiety disorder, unspecified: Secondary | ICD-10-CM | POA: Diagnosis not present

## 2020-09-09 LAB — CBC WITH DIFFERENTIAL/PLATELET
Basophils Absolute: 0.1 x10E3/uL (ref 0.0–0.2)
Basos: 1 %
EOS (ABSOLUTE): 0.1 x10E3/uL (ref 0.0–0.4)
Eos: 2 %
Hematocrit: 42.7 % (ref 34.0–46.6)
Hemoglobin: 14.1 g/dL (ref 11.1–15.9)
Immature Grans (Abs): 0 x10E3/uL (ref 0.0–0.1)
Immature Granulocytes: 0 %
Lymphocytes Absolute: 1.6 x10E3/uL (ref 0.7–3.1)
Lymphs: 26 %
MCH: 30.4 pg (ref 26.6–33.0)
MCHC: 33 g/dL (ref 31.5–35.7)
MCV: 92 fL (ref 79–97)
Monocytes Absolute: 0.4 x10E3/uL (ref 0.1–0.9)
Monocytes: 7 %
Neutrophils Absolute: 3.8 x10E3/uL (ref 1.4–7.0)
Neutrophils: 64 %
Platelets: 278 x10E3/uL (ref 150–450)
RBC: 4.64 x10E6/uL (ref 3.77–5.28)
RDW: 12.3 % (ref 11.7–15.4)
WBC: 6 x10E3/uL (ref 3.4–10.8)

## 2020-09-09 LAB — COMPREHENSIVE METABOLIC PANEL
ALT: 42 IU/L — ABNORMAL HIGH (ref 0–32)
AST: 18 IU/L (ref 0–40)
Albumin/Globulin Ratio: 2 (ref 1.2–2.2)
Albumin: 4.7 g/dL (ref 3.9–5.0)
Alkaline Phosphatase: 60 IU/L (ref 44–121)
BUN/Creatinine Ratio: 18 (ref 9–23)
BUN: 12 mg/dL (ref 6–20)
Bilirubin Total: 0.2 mg/dL (ref 0.0–1.2)
CO2: 23 mmol/L (ref 20–29)
Calcium: 9.8 mg/dL (ref 8.7–10.2)
Chloride: 101 mmol/L (ref 96–106)
Creatinine, Ser: 0.68 mg/dL (ref 0.57–1.00)
Globulin, Total: 2.4 g/dL (ref 1.5–4.5)
Glucose: 84 mg/dL (ref 65–99)
Potassium: 4.5 mmol/L (ref 3.5–5.2)
Sodium: 140 mmol/L (ref 134–144)
Total Protein: 7.1 g/dL (ref 6.0–8.5)
eGFR: 126 mL/min/{1.73_m2} (ref >59)

## 2020-09-09 LAB — VITAMIN B12: Vitamin B-12: 481 pg/mL (ref 232–1245)

## 2020-09-09 LAB — LIPID PANEL WITH LDL/HDL RATIO
Cholesterol, Total: 140 mg/dL (ref 100–199)
HDL: 32 mg/dL — ABNORMAL LOW (ref >39)
LDL Chol Calc (NIH): 78 mg/dL (ref 0–99)
LDL/HDL Ratio: 2.4 ratio (ref 0.0–3.2)
Triglycerides: 175 mg/dL — ABNORMAL HIGH (ref 0–149)
VLDL Cholesterol Cal: 30 mg/dL (ref 5–40)

## 2020-09-09 LAB — INSULIN, RANDOM: INSULIN: 23.2 u[IU]/mL (ref 2.6–24.9)

## 2020-09-09 LAB — HEMOGLOBIN A1C
Est. average glucose Bld gHb Est-mCnc: 100 mg/dL
Hgb A1c MFr Bld: 5.1 % (ref 4.8–5.6)

## 2020-09-09 LAB — FOLATE: Folate: 6.8 ng/mL (ref >3.0)

## 2020-09-09 LAB — VITAMIN D 25 HYDROXY (VIT D DEFICIENCY, FRACTURES): Vit D, 25-Hydroxy: 18.1 ng/mL — ABNORMAL LOW (ref 30.0–100.0)

## 2020-09-09 NOTE — Progress Notes (Signed)
Dear Dawn Hartman,   Thank you for referring Dawn Hartman to our clinic. The following note includes my evaluation and treatment recommendations.  Chief Complaint:   OBESITY Dawn Hartman (MR# [id number]) is a 25 y.o. female who presents for evaluation and treatment of obesity and related comorbidities. Current BMI is Body mass index is 31.47 kg/m. Dawn Hartman has been struggling with her weight for many years and has been unsuccessful in either losing weight, maintaining weight loss, or reaching her healthy weight goal.  Dawn Hartman is currently in the action stage of change and ready to dedicate time achieving and maintaining a healthier weight. Dawn Hartman is interested in becoming our patient and working on intensive lifestyle modifications including (but not limited to) diet and exercise for weight loss.  Dawn Hartman was referred by Dawn Pearson, MD. Pt has a history of emotional eating following suicide attempt. Eats at [place] 2-4 times a week. 8:30-9 AM- [employer] bacon and egg biscuit with hash brown and sweet tea (eat all, drink some) (feel satisfied); Snack- bowl of cereal (Fruity Pebbles) (2 cups) with 2% milk (drink afterwards) (satisfied); Snack- chocolate chip cookies/bag of chips/single serve [employer] (just to eat); Between 7-8 PM [employer] 8 count grilled chicken with medium fries and sweet tea (eat and drink all) (feel full).  Dawn Hartman's habits were reviewed today and are as follows: her desired weight loss is 35 lbs, she started gaining weight about a year ago, her heaviest weight ever was 215 pounds, she is a picky eater and doesn't like to eat healthier foods, she has significant food cravings issues, she snacks frequently in the evenings, she skips meals frequently, she is frequently drinking liquids with calories, she frequently makes poor food choices, she has binge eating behaviors and she struggles with emotional eating.  Depression Screen [name]'s Food and Mood  (modified PHQ-9) score was 15.  Depression screen PHQ 2/9 09/08/2020  Decreased Interest 2  Down, Depressed, Hopeless 3  PHQ - 2 Score 5  Altered sleeping 1  Tired, decreased energy 2  Change in appetite 2  Feeling bad or failure about yourself  2  Trouble concentrating 1  Moving slowly or fidgety/restless 2  Suicidal thoughts 0  PHQ-9 Score 15  Difficult doing work/chores Very difficult   Subjective:   1. Other fatigue Dawn Hartman admits to daytime somnolence and admits to waking up still tired. Patent has a history of symptoms of daytime fatigue and morning fatigue. Dawn Hartman generally gets 5-8 hours of sleep per night, and states that she has generally restful sleep. Snoring is present. Apneic episodes are not present. Epworth Sleepiness Score is 9. EKG normal sinus rhythm at 69 bpm.  2. Shortness of breath on exertion Dawn Hartman notes increasing shortness of breath with exercising and seems to be worsening over time with weight gain. She notes getting out of breath sooner with activity than she used to. This has gotten worse recently. Dawn Hartman denies shortness of breath at rest or orthopnea. EKG normal sinus rhythm at 69 bpm.  3. Other specified hypothyroidism Recently diagnosed. Dawn Hartman is now on levothyroxine 50 mcg daily. TSH ordered and resulted 2 weeks ago- TSH 13.7.  4. Anxiety and depression Dawn Hartman is on Lexapro, Zoloft, and hydroxyzine. Pt denies suicidal or homicidal ideations. She reports that she takes her medication consistently.  5. At risk for deficient intake of food Dawn Hartman is at risk for deficient intake of food due to skipping meals.   Assessment/Plan:   1. Other fatigue Sarye does  feel that her weight is causing her energy to be lower than it should be. Fatigue may be related to obesity, depression or many other causes. Labs will be ordered, and in the meanwhile, Dawn Hartman will focus on self care including making healthy food choices, increasing physical activity and focusing  on stress reduction. Check labs today.  - Vitamin B12 - Comprehensive metabolic panel - EKG 52-WUXL - Folate - Hemoglobin A1c - Insulin, random - VITAMIN D 25 Hydroxy (Vit-D Deficiency, Fractures)  2. Shortness of breath on exertion Alida does feel that she gets out of breath more easily that she used to when she exercises. Abryana's shortness of breath appears to be obesity related and exercise induced. She has agreed to work on weight loss and gradually increase exercise to treat her exercise induced shortness of breath. Will continue to monitor closely. Check labs today.  - CBC with Differential/Platelet - Lipid Panel With LDL/HDL Ratio  3. Other specified hypothyroidism Patient with long-standing hypothyroidism, on levothyroxine therapy. She appears euthyroid. Orders and follow up as documented in patient record. Repeat labs in 4 weeks.  Counseling . Good thyroid control is important for overall health. Supratherapeutic thyroid levels are dangerous and will not improve weight loss results. . The correct way to take levothyroxine is fasting, with water, separated by at least 30 minutes from breakfast, and separated by more than 4 hours from calcium, iron, multivitamins, acid reflux medications (PPIs).    4. Anxiety and depression Behavior modification techniques were discussed today to help Mykell deal with her anxiety.  Orders and follow up as documented in patient record. Behavior modification techniques were discussed today to help Mayia deal with her emotional/non-hunger eating behaviors.  Orders and follow up as documented in patient record. Follow up at next appt.  5. Screening for depression Dawn Hartman had a positive depression screening. Depression is commonly associated with obesity and often results in emotional eating behaviors. We will monitor this closely and work on CBT to help improve the non-hunger eating patterns. Referral to Psychology may be required if no improvement  is seen as she continues in our clinic.  6. At risk for deficient intake of food Dawn Hartman was given approximately 15 minutes of deficit intake of food prevention counseling today. Dawn Hartman is at risk for eating too few calories based on current food recall. She was encouraged to focus on meeting caloric and protein goals according to her recommended meal plan.   7. Class 1 obesity with serious comorbidity and body mass index (BMI) of 31.0 to 31.9 in adult, unspecified obesity type Dawn Hartman is currently in the action stage of change and her goal is to continue with weight loss efforts. I recommend Dawn Hartman begin the structured treatment plan as follows:  She has agreed to the Category 3 Plan.  Exercise goals: As is   Behavioral modification strategies: increasing lean protein intake, meal planning and cooking strategies, keeping healthy foods in the home and planning for success.  She was informed of the importance of frequent follow-up visits to maximize her success with intensive lifestyle modifications for her multiple health conditions. She was informed we would discuss her lab results at her next visit unless there is a critical issue that needs to be addressed sooner. Dawn Hartman agreed to keep her next visit at the agreed upon time to discuss these results.  Objective:   Blood pressure 107/72, pulse 67, temperature 98.2 F (36.8 C), height 5\' 6"  (1.676 m), weight 195 lb (88.5 kg), SpO2 99 %.  Body mass index is 31.47 kg/m.  EKG: Normal sinus rhythm, rate 69.  Indirect Calorimeter completed today shows a VO2 of 279 and a REE of 1940.  Her calculated basal metabolic rate is 0814 thus her basal metabolic rate is better than expected.  General: Cooperative, alert, well developed, in no acute distress. HEENT: Conjunctivae and lids unremarkable. Cardiovascular: Regular rhythm.  Lungs: Normal work of breathing. Neurologic: No focal deficits.   Lab Results  Component Value Date   CREATININE 0.68  09/08/2020   BUN 12 09/08/2020   NA 140 09/08/2020   K 4.5 09/08/2020   CL 101 09/08/2020   CO2 23 09/08/2020   Lab Results  Component Value Date   ALT 42 (H) 09/08/2020   AST 18 09/08/2020   ALKPHOS 60 09/08/2020   BILITOT 0.2 09/08/2020   Lab Results  Component Value Date   HGBA1C 5.1 09/08/2020   Lab Results  Component Value Date   INSULIN 23.2 09/08/2020   No results found for: TSH Lab Results  Component Value Date   CHOL 140 09/08/2020   HDL 32 (L) 09/08/2020   LDLCALC 78 09/08/2020   TRIG 175 (H) 09/08/2020   Lab Results  Component Value Date   WBC 6.0 09/08/2020   HGB 14.1 09/08/2020   HCT 42.7 09/08/2020   MCV 92 09/08/2020   PLT 278 09/08/2020   No results found for: IRON, TIBC, FERRITIN  Attestation Statements:   Reviewed by clinician on day of visit: allergies, medications, problem list, medical history, surgical history, family history, social history, and previous encounter notes.  Coral Ceo, CMA, am acting as transcriptionist for Coralie Common, MD.   This is the patient's first visit at [facility]. The patient's NEW PATIENT PACKET was reviewed at length. Included in the packet: current and past health history, medications, allergies, ROS, gynecologic history (women only), surgical history, family history, social history, weight history, weight loss surgery history (for those that have had weight loss surgery), nutritional evaluation, mood and food questionnaire, PHQ9, Epworth questionnaire, sleep habits questionnaire, patient life and health improvement goals questionnaire. These will all be scanned into the patient's chart under media.   During the visit, I independently reviewed the patient's EKG, bioimpedance scale results, and indirect calorimeter results. I used this information to tailor a meal plan for the patient that will help her to lose weight and will improve her obesity-related conditions going forward. I  performed a medically necessary appropriate examination and/or evaluation. I discussed the assessment and treatment plan with the patient. The patient was provided an opportunity to ask questions and all were answered. The patient agreed with the plan and demonstrated an understanding of the instructions. Labs were ordered at this visit and will be reviewed at the next visit unless more critical results need to be addressed immediately. Clinical information was updated and documented in the EMR.   Time spent on visit including pre-visit chart review and post-visit care was 45 minutes.   A separate 15 minutes was spent on risk counseling (see above).  I have reviewed the above documentation for accuracy and completeness, and I agree with the above. - Jinny Blossom, MD

## 2020-09-22 ENCOUNTER — Ambulatory Visit (INDEPENDENT_AMBULATORY_CARE_PROVIDER_SITE_OTHER): Payer: BLUE CROSS/BLUE SHIELD | Admitting: Family Medicine

## 2020-09-22 ENCOUNTER — Encounter (INDEPENDENT_AMBULATORY_CARE_PROVIDER_SITE_OTHER): Payer: Self-pay | Admitting: Family Medicine

## 2020-09-22 ENCOUNTER — Other Ambulatory Visit: Payer: Self-pay

## 2020-09-22 VITALS — BP 99/65 | HR 72 | Temp 98.7°F | Ht 66.0 in | Wt 192.0 lb

## 2020-09-22 DIAGNOSIS — R748 Abnormal levels of other serum enzymes: Secondary | ICD-10-CM

## 2020-09-22 DIAGNOSIS — E785 Hyperlipidemia, unspecified: Secondary | ICD-10-CM

## 2020-09-22 DIAGNOSIS — Z6831 Body mass index (BMI) 31.0-31.9, adult: Secondary | ICD-10-CM

## 2020-09-22 DIAGNOSIS — Z9189 Other specified personal risk factors, not elsewhere classified: Secondary | ICD-10-CM | POA: Diagnosis not present

## 2020-09-22 DIAGNOSIS — E8881 Metabolic syndrome: Secondary | ICD-10-CM | POA: Diagnosis not present

## 2020-09-22 DIAGNOSIS — E559 Vitamin D deficiency, unspecified: Secondary | ICD-10-CM

## 2020-09-22 DIAGNOSIS — E669 Obesity, unspecified: Secondary | ICD-10-CM

## 2020-09-22 MED ORDER — VITAMIN D (ERGOCALCIFEROL) 1.25 MG (50000 UNIT) PO CAPS: 50000.0000 [IU] | ORAL_CAPSULE | ORAL | 0 refills | Status: DC

## 2020-09-28 NOTE — Progress Notes (Signed)
Chief Complaint:   OBESITY Dawn Hartman is here to discuss her progress with her obesity treatment plan along with follow-up of her obesity related diagnoses. Dawn Hartman is on the Category 3 Plan and states she is following her eating plan approximately 90% of the time. [name] states she is walking the dog 25 minutes 5 times per week.  Today's visit was #: 2 Starting weight: 195 lbs Starting date: 09/08/2020 Today's weight: 192 lbs Today's date: 09/22/2020 Total lbs lost to date: 3 Total lbs lost since last in-office visit: 3  Interim History: [name] found the meal plan to be not as difficult as she thought it was going to be. Some foods she didn't eat much of- didn't do yogurt or string cheese. Supper was the hardest option to follow. Seeing what family was eating made her crave what family was eating. For snack calories she ate fruit- apples + peanut butter, watermelon, or cantaloupe.  Subjective:   1. Dyslipidemia Dawn Hartman has a low HDL of 32, triglycerides 175, and LDL 78. She is not on statin therapy. Age keeps pt from risk stratifying.   2. Elevated liver enzymes Dawn Hartman has an ALT of 42, AST 18, and alkaline phosphate 60. Previously WNL.  3. Vitamin D deficiency [name]'s Vit D level is 18.1. She reports fatigue.  4. Insulin resistance Dawn Hartman's A1c is 5.1 and insulin level 23.2. She reports she was concerned about blood sugar issues for the past few years.   5. At risk for osteoporosis Dawn Hartman is at higher risk of osteopenia and osteoporosis due to Vitamin D deficiency.   Assessment/Plan:   1. Dyslipidemia Cardiovascular risk and specific lipid/LDL goals reviewed.  We discussed several lifestyle modifications today and Analyce will continue to work on diet, exercise and weight loss efforts. Orders and follow up as documented in patient record.  Repeat labs in 3+ months.  Counseling Intensive lifestyle modifications are the first line treatment for this issue. Dietary changes:  Increase soluble fiber. Decrease simple carbohydrates. Exercise changes: Moderate to vigorous-intensity aerobic activity 150 minutes per week if tolerated. Lipid-lowering medications: see documented in medical record.  2. Elevated liver enzymes Repeat labs in 3 months.  3. Vitamin D deficiency Low Vitamin D level contributes to fatigue and are associated with obesity, breast, and colon cancer. She agrees to start to take prescription Vitamin D @50 ,000 IU every week and will follow-up for routine testing of Vitamin D, at least 2-3 times per year to avoid over-replacement. - Vitamin D, Ergocalciferol, (DRISDOL) 1.25 MG (50000 UNIT) CAPS capsule; Take 1 capsule (50,000 Units total) by mouth every 7 (seven) days.  Dispense: 4 capsule; Refill: 0  4. Insulin resistance Dawn Hartman will continue to work on weight loss, exercise, and decreasing simple carbohydrates to help decrease the risk of diabetes. Dawn Hartman agreed to follow-up with [place] as directed to closely monitor her progress. Repeat labs in 3-4 months.  5. At risk for osteoporosis Duru was given approximately 30 minutes of osteoporosis prevention counseling today. Dawn Hartman is at risk for osteopenia and osteoporosis due to her Vitamin D deficiency. She was encouraged to take her Vitamin D and follow her higher calcium diet and increase strengthening exercise to help strengthen her bones and decrease her risk of osteopenia and osteoporosis.  Repetitive spaced learning was employed today to elicit superior memory formation and behavioral change.  6. Class 1 obesity with serious comorbidity and body mass index (BMI) of 31.0 to 31.9 in adult, unspecified obesity type  Dawn Hartman is currently in  the action stage of change. As such, her goal is to continue with weight loss efforts. She has agreed to the Category 3 Plan.   Exercise goals: All adults should avoid inactivity. Some physical activity is better than none, and adults who participate in any amount of  physical activity gain some health benefits.  Behavioral modification strategies: increasing lean protein intake, meal planning and cooking strategies, keeping healthy foods in the home and planning for success.  Birttany has agreed to follow-up with our clinic in 2-3 weeks. She was informed of the importance of frequent follow-up visits to maximize her success with intensive lifestyle modifications for her multiple health conditions.   Objective:   Blood pressure 99/65, pulse 72, temperature 98.7 F (37.1 C), height 5\' 6"  (1.676 m), weight 192 lb (87.1 kg), SpO2 94 %. Body mass index is 30.99 kg/m.  General: Cooperative, alert, well developed, in no acute distress. HEENT: Conjunctivae and lids unremarkable. Cardiovascular: Regular rhythm.  Lungs: Normal work of breathing. Neurologic: No focal deficits.   Lab Results  Component Value Date   CREATININE 0.68 09/08/2020   BUN 12 09/08/2020   NA 140 09/08/2020   K 4.5 09/08/2020   CL 101 09/08/2020   CO2 23 09/08/2020   Lab Results  Component Value Date   ALT 42 (H) 09/08/2020   AST 18 09/08/2020   ALKPHOS 60 09/08/2020   BILITOT 0.2 09/08/2020   Lab Results  Component Value Date   HGBA1C 5.1 09/08/2020   Lab Results  Component Value Date   INSULIN 23.2 09/08/2020   No results found for: TSH Lab Results  Component Value Date   CHOL 140 09/08/2020   HDL 32 (L) 09/08/2020   LDLCALC 78 09/08/2020   TRIG 175 (H) 09/08/2020   Lab Results  Component Value Date   WBC 6.0 09/08/2020   HGB 14.1 09/08/2020   HCT 42.7 09/08/2020   MCV 92 09/08/2020   PLT 278 09/08/2020     Attestation Statements:   Reviewed by clinician on day of visit: allergies, medications, problem list, medical history, surgical history, family history, social history, and previous encounter notes.  Coral Ceo, CMA, am acting as transcriptionist for Coralie Common, MD.   I have reviewed the above documentation for accuracy and  completeness, and I agree with the above. - Jinny Blossom, MD

## 2020-10-13 ENCOUNTER — Ambulatory Visit (INDEPENDENT_AMBULATORY_CARE_PROVIDER_SITE_OTHER): Payer: Managed Care, Other (non HMO) | Admitting: Family Medicine

## 2021-01-04 ENCOUNTER — Ambulatory Visit (INDEPENDENT_AMBULATORY_CARE_PROVIDER_SITE_OTHER): Payer: Managed Care, Other (non HMO) | Admitting: Family Medicine

## 2021-01-04 ENCOUNTER — Encounter (INDEPENDENT_AMBULATORY_CARE_PROVIDER_SITE_OTHER): Payer: Self-pay

## 2021-01-04 DIAGNOSIS — E669 Obesity, unspecified: Secondary | ICD-10-CM

## 2021-08-03 ENCOUNTER — Encounter: Payer: Self-pay | Admitting: Physician Assistant

## 2021-08-19 ENCOUNTER — Encounter: Payer: Self-pay | Admitting: Physician Assistant

## 2021-08-19 ENCOUNTER — Ambulatory Visit: Payer: Managed Care, Other (non HMO) | Admitting: Physician Assistant

## 2021-08-19 ENCOUNTER — Other Ambulatory Visit (INDEPENDENT_AMBULATORY_CARE_PROVIDER_SITE_OTHER): Payer: Managed Care, Other (non HMO)

## 2021-08-19 VITALS — BP 104/78 | HR 74 | Ht 66.0 in | Wt 211.1 lb

## 2021-08-19 DIAGNOSIS — R1011 Right upper quadrant pain: Secondary | ICD-10-CM

## 2021-08-19 DIAGNOSIS — R7401 Elevation of levels of liver transaminase levels: Secondary | ICD-10-CM

## 2021-08-19 DIAGNOSIS — R197 Diarrhea, unspecified: Secondary | ICD-10-CM

## 2021-08-19 LAB — COMPREHENSIVE METABOLIC PANEL
ALT: 25 U/L (ref 0–35)
AST: 17 U/L (ref 0–37)
Albumin: 4.5 g/dL (ref 3.5–5.2)
Alkaline Phosphatase: 48 U/L (ref 39–117)
BUN: 9 mg/dL (ref 6–23)
CO2: 27 mEq/L (ref 19–32)
Calcium: 9.4 mg/dL (ref 8.4–10.5)
Chloride: 105 mEq/L (ref 96–112)
Creatinine, Ser: 0.7 mg/dL (ref 0.40–1.20)
GFR: 121.77 mL/min (ref >60.00)
Glucose, Bld: 100 mg/dL — ABNORMAL HIGH (ref 70–99)
Potassium: 4.5 mEq/L (ref 3.5–5.1)
Sodium: 140 mEq/L (ref 135–145)
Total Bilirubin: 0.4 mg/dL (ref 0.2–1.2)
Total Protein: 7.4 g/dL (ref 6.0–8.3)

## 2021-08-19 MED ORDER — OMEPRAZOLE 20 MG PO CPDR: 20.0000 mg | DELAYED_RELEASE_CAPSULE | Freq: Every day | ORAL | 1 refills | Status: DC

## 2021-08-19 NOTE — Patient Instructions (Signed)
You have been scheduled for an abdominal ultrasound at [facility] (1st floor of hospital) on   08/26/2021 at 8:00am. Please arrive 15 minutes prior to your appointment for registration. Make certain not to have anything to eat or drink after midnight prior to your appointment. Should you need to reschedule your appointment, please contact radiology at [phone]. This test typically takes about 30 minutes to perform.  ? ?Your provider has requested that you go to the basement level for lab work before leaving today. Press "B" on the elevator. The lab is located at the first door on the left as you exit the elevator.  ? ?We have sent the following medications to your pharmacy for you to pick up at your convenience: Omeprazole 20 mg ? ?Follow up in 2 months with Lovett Calender, You will need to call back for that appointment  ? ?Due to recent changes in healthcare laws, you may see the results of your imaging and laboratory studies on MyChart before your provider has had a chance to review them.  We understand that in some cases there may be results that are confusing or concerning to you. Not all laboratory results come back in the same time frame and the provider may be waiting for multiple results in order to interpret others.  Please give [place] 48 hours in order for your provider to thoroughly review all the results before contacting the office for clarification of your results.   ? ?If you are age 24 or older, your body mass index should be between 23-30. Your Body mass index is 34.08 kg/m?Marland Kitchen If this is out of the aforementioned range listed, please consider follow up with your Primary Care Provider. ? ?If you are age 24 or younger, your body mass index should be between 19-25. Your Body mass index is 34.08 kg/m?Marland Kitchen If this is out of the aformentioned range listed, please consider follow up with your Primary Care Provider.  ? ?________________________________________________________ ? ?The [facility]  providers would like to encourage you to use [facility] to communicate with providers for non-urgent requests or questions.  Due to long hold times on the telephone, sending your provider a message by [facility] may be a faster and more efficient way to get a response.  Please allow 48 business hours for a response.  Please remember that this is for non-urgent requests.  ?_______________________________________________________  ? ?Thank you for choosing [facility] ? ?Lovett Calender  ?

## 2021-08-19 NOTE — Progress Notes (Signed)
? ?Chief Complaint: Elevated ALT and right upper quadrant pain ? ?HPI: ?   Dawn Hartman is a 26 year old female with a past medical history as listed below, who was referred to me by Cher Nakai, MD for a complaint of elevated ALT and right upper quadrant pain.   ?   11/06/2017 CMP within normal ALT. ?   11/06/2017 CT of the abdomen pelvis with contrast was normal.  No liver abnormality or gallstones. ?   09/08/2020 CMP with an ALT of 42. ?   07/07/2021 CMP with an ALT of 34 and otherwise normal.  CBC normal.  07/21/2021 ALT 37, CMP otherwise normal. ?   Today, the patient presents to clinic accompanied by her mother.  She tells me that for the past 6 months she has had a right upper quadrant pain which seems to increase and decrease in severity throughout the day.  Sometimes it wakes her up from her sleep and makes her ball up for at least 30 to 60 minutes before it eases off.  It is a constant 2-3/10 and can increase to a 9-10/10 described as sharp/cramping.  It is definitively worse about 30 to 35 minutes after eating pretty much anything so she has decreased to eating only once or twice a day due to the discomfort.  Along with this has also noticed a change towards diarrhea/looser stools which are more urgent 8-10 times a day with some accompanying abdominal cramping.  Prior to this used to have 2 solid stools a day.  Does tell me that she is currently being worked up for her thyroid.  Associated symptoms include some nausea. ?   Denies fever, chills, weight loss or blood in her stool. ? ?Past Medical History:  ?Diagnosis Date  ? Acromioclavicular joint separation 12/08/2017  ? Anemia   ? Anxiety   ? Asthma   ? Interm  ? Breast mass, right 03/2012  ? Complication of anesthesia   ? Depression   ? Dislocation of left shoulder joint   ? AC seperation  ? Family history of adverse reaction to anesthesia   ? mother vomits while under anesthesia  ? Food allergy   ? Headache   ? High triglycerides   ? takes fish oil  supplement  ? History of epistaxis   ? 2-3 hours  ? History of MRSA infection age 89  ? finger  ? Hyperlipidemia   ? Hypothyroidism   ? Ileitis 01/15/2014  ? Mesenteric adenitis 01/18/2017  ? Other fatigue   ? Ovarian cyst   ? PONV (postoperative nausea and vomiting)   ? Shortness of breath   ? Shortness of breath on exertion   ? Vasovagal episode   ? during nosebleed  ? ? ?Past Surgical History:  ?Procedure Laterality Date  ? ADENOIDECTOMY    ? BREAST LUMPECTOMY WITH NEEDLE LOCALIZATION  04/15/2012  ? Procedure: BREAST LUMPECTOMY WITH NEEDLE LOCALIZATION;  Surgeon: Rolm Bookbinder, MD;  Location: [place] [facility];  Service: General;  Laterality: Right;  right breast wire localized mass excision  ? BREAST SURGERY    ? INCISION AND DRAINAGE Left 01/22/2018  ? Procedure: INCISION AND DRAINAGE LEFT SHOULDER;  Surgeon: [name], MD;  Location: [facility];  Service: Orthopedics;  Laterality: Left;  ? MYRINGOTOMY  04/1999; 04/2002  ? NASAL CAUTERIZATION  Bilateral   ? RECONSTRUCTION OF CORACOCLAVICULAR LIGAMENT Left 12/18/2017  ? Procedure: RECONSTRUCTION OF CORACOCLAVICULAR LIGAMENT LEFT SHOULDER;  Surgeon: [name], MD;  Location:  [place];  Service: Orthopedics;  Laterality: Left;  ? TONSILLECTOMY  03/2010  ? ? ?Current Outpatient Medications  ?Medication Sig Dispense Refill  ? albuterol (PROVENTIL HFA;VENTOLIN HFA) 108 (90 Base) MCG/ACT inhaler Inhale 1-2 puffs into the lungs every 6 (six) hours as needed for wheezing or shortness of breath. 1 Inhaler 0  ? escitalopram (LEXAPRO) 10 MG tablet Take 1 tablet by mouth daily.    ? hydrOXYzine (ATARAX/VISTARIL) 25 MG tablet Take 1 tablet (25 mg total) by mouth every 6 (six) hours as needed for anxiety. 30 tablet 0  ? levothyroxine (SYNTHROID) 50 MCG tablet Take 50 mcg by mouth daily before breakfast.    ? sertraline (ZOLOFT) 50 MG tablet Take 1 tablet (50 mg total) by mouth daily. For mood control 30 tablet 0  ?  triamcinolone cream (KENALOG) 0.1 % Apply 1 application topically as needed.    ? ?No current facility-administered medications for this visit.  ? ? ?Allergies as of 08/19/2021 - Review Complete 08/19/2021  ?Allergen Reaction Noted  ? Adhesive [tape]  12/13/2017  ? Freederm adhesive remover [new skin]  09/08/2020  ? Pineapple  09/08/2020  ? Latex Rash 02/08/2018  ? ? ?Family History  ?Problem Relation Age of Onset  ? Heart disease Paternal Grandfather   ?     atrial fib.  ? Asthma Paternal Grandfather   ?     as a child  ? Diabetes Maternal Grandmother   ? Hypertension Maternal Grandmother   ? Leukemia Maternal Grandfather   ? Cancer Paternal Grandmother   ?     breast CA  ? Anesthesia problems Mother   ?     post-op N/V  ? Anxiety disorder Mother   ? Hypertension Father   ? Hyperlipidemia Father   ? Sleep apnea Father   ? Asthma Sister   ? ? ?Social History  ? ?Socioeconomic History  ? Marital status: Significant Other  ?  Spouse name: Liane Comber  ? Number of children: Not on file  ? Years of education: Not on file  ? Highest education level: Not on file  ?Occupational History  ? Occupation: Arts development officer  ?  Employer: [employer]  ?Tobacco Use  ? Smoking status: Never  ? Smokeless tobacco: Never  ?Vaping Use  ? Vaping Use: Never used  ?Substance and Sexual Activity  ? Alcohol use: No  ? Drug use: No  ? Sexual activity: Never  ?  Birth control/protection: Abstinence  ?Other Topics Concern  ? Not on file  ?Social History Narrative  ? Not on file  ? ?Social Determinants of Health  ? ?Financial Resource Strain: Not on file  ?Food Insecurity: Not on file  ?Transportation Needs: Not on file  ?Physical Activity: Not on file  ?Stress: Not on file  ?Social Connections: Not on file  ?Intimate Partner Violence: Not on file  ? ? ?Review of Systems:    ?Constitutional: No weight loss, fever or chills ?Skin: No rash  ?Cardiovascular: No chest pain ?Respiratory: No SOB ?Gastrointestinal: See HPI and otherwise  negative ?Genitourinary: No dysuria  ?Neurological: No headache, dizziness or syncope ?Musculoskeletal: No new muscle or joint pain ?Hematologic: No bleeding  ?Psychiatric: No history of depression or anxiety ? ? Physical Exam:  ?Vital signs: ?BP 104/78   Pulse 74   Ht '5\' 6"'$  (1.676 m)   Wt 211 lb 2 oz (95.8 kg)   SpO2 98%   BMI 34.08 kg/m?   ? ?Constitutional:  Very Pleasant young Caucasian female appears to be in NAD, Well developed, Well nourished, alert and cooperative ?Head:  Normocephalic and atraumatic. ?Eyes:   PEERL, EOMI. No icterus. Conjunctiva pink. ?Ears:  Normal auditory acuity. ?Neck:  Supple ?Throat: Oral cavity and pharynx without inflammation, swelling or lesion.  ?Respiratory: Respirations even and unlabored. Lungs clear to auscultation bilaterally.   No wheezes, crackles, or rhonchi.  ?Cardiovascular: Normal S1, S2. No MRG. Regular rate and rhythm. No peripheral edema, cyanosis or pallor.  ?Gastrointestinal:  Soft, nondistended, marked right upper quadrant TTP with involuntary guarding. Normal bowel sounds. No appreciable masses or hepatomegaly. ?Rectal:  Not performed.  ?Msk:  Symmetrical without gross deformities. Without edema, no deformity or joint abnormality.  ?Neurologic:  Alert and  oriented x4;  grossly normal neurologically.  ?Skin:   Dry and intact without significant lesions or rashes. ?Psychiatric: Demonstrates good judgement and reason without abnormal affect or behaviors. ? ?See HPI for most recent labs. ? ?Assessment: ?1.  Right upper quadrant pain: 6 months of right upper quadrant pain worse about 30 minutes after eating accompanied by diarrhea and some nausea; consider gallbladder etiology versus gastric origin ?2.  Diarrhea: With above ?3.  Elevated ALT: Minimal elevation in ALT over the past couple of years, other liver enzymes are normal; most likely fatty liver ? ?Plan: ?1.  Scheduled patient for right upper quadrant ultrasound for right upper quadrant pain and elevated  LFT ?2.  Repeat CMP today and will add celiac studies given diarrhea ?3.  Started the patient on Omeprazole 20 mg every morning, 30-60 minutes before breakfast #30 with 1 refill ?4.  Discussed with patient that this could

## 2021-08-20 NOTE — Progress Notes (Signed)
Attending Physician's Attestation  ? ?I have reviewed the chart.  ? ?I agree with the Advanced Practitioner's note, impression, and recommendations with any updates as below. ?Consider having hepatic function panel/amylase/lipase available that when she has severe episode she comes in within 6 to 12 hours has labs obtained to see if her LFTs are pancreatic enzymes rise with a severe episode of pain.  If no evidence of findings on ultrasound then consider cross-sectional imaging and endoscopy. ? ? ?Justice Britain, MD ?[facility] ?Advanced Endoscopy ?Office # [phone] ? ?

## 2021-08-22 LAB — TISSUE TRANSGLUTAMINASE, IGA: (tTG) Ab, IgA: <1 U/mL

## 2021-08-22 LAB — IGA: Immunoglobulin A: 87 mg/dL (ref 47–310)

## 2021-08-26 ENCOUNTER — Other Ambulatory Visit: Payer: Self-pay

## 2021-08-26 ENCOUNTER — Ambulatory Visit (HOSPITAL_COMMUNITY)
Admission: RE | Admit: 2021-08-26 | Discharge: 2021-08-26 | Disposition: A | Discharge status: Home / Self Care | Payer: Managed Care, Other (non HMO) | Source: Ambulatory Visit | Attending: Physician Assistant | Admitting: Physician Assistant

## 2021-08-26 DIAGNOSIS — R7401 Elevation of levels of liver transaminase levels: Secondary | ICD-10-CM | POA: Diagnosis present

## 2021-08-26 DIAGNOSIS — R197 Diarrhea, unspecified: Secondary | ICD-10-CM | POA: Insufficient documentation

## 2021-08-26 DIAGNOSIS — R1011 Right upper quadrant pain: Secondary | ICD-10-CM | POA: Diagnosis not present

## 2021-09-08 ENCOUNTER — Encounter (HOSPITAL_COMMUNITY)
Admission: RE | Admit: 2021-09-08 | Discharge: 2021-09-08 | Disposition: A | Discharge status: Home / Self Care | Payer: Managed Care, Other (non HMO) | Source: Ambulatory Visit | Attending: Physician Assistant | Admitting: Physician Assistant

## 2021-09-08 DIAGNOSIS — R197 Diarrhea, unspecified: Secondary | ICD-10-CM

## 2021-09-08 DIAGNOSIS — R1011 Right upper quadrant pain: Secondary | ICD-10-CM | POA: Diagnosis present

## 2021-09-08 DIAGNOSIS — R7401 Elevation of levels of liver transaminase levels: Secondary | ICD-10-CM

## 2021-09-08 MED ORDER — TECHNETIUM TC 99M MEBROFENIN IV KIT
5.0000 | PACK | Freq: Once | INTRAVENOUS | Status: AC | PRN
  Administered 2021-09-08: 5 via INTRAVENOUS

## 2021-09-12 ENCOUNTER — Other Ambulatory Visit: Payer: Self-pay

## 2021-09-12 DIAGNOSIS — R1011 Right upper quadrant pain: Secondary | ICD-10-CM

## 2021-09-13 ENCOUNTER — Other Ambulatory Visit: Payer: Self-pay | Admitting: Physician Assistant

## 2021-09-21 ENCOUNTER — Encounter: Payer: Self-pay | Admitting: Gastroenterology

## 2021-09-21 ENCOUNTER — Ambulatory Visit (AMBULATORY_SURGERY_CENTER): Payer: Managed Care, Other (non HMO) | Admitting: Gastroenterology

## 2021-09-21 VITALS — BP 119/70 | HR 80 | Temp 97.8°F | Resp 10 | Ht 66.0 in | Wt 211.0 lb

## 2021-09-21 DIAGNOSIS — K219 Gastro-esophageal reflux disease without esophagitis: Secondary | ICD-10-CM | POA: Diagnosis not present

## 2021-09-21 DIAGNOSIS — R197 Diarrhea, unspecified: Secondary | ICD-10-CM | POA: Diagnosis not present

## 2021-09-21 DIAGNOSIS — K2289 Other specified disease of esophagus: Secondary | ICD-10-CM | POA: Diagnosis not present

## 2021-09-21 DIAGNOSIS — K449 Diaphragmatic hernia without obstruction or gangrene: Secondary | ICD-10-CM

## 2021-09-21 DIAGNOSIS — K222 Esophageal obstruction: Secondary | ICD-10-CM | POA: Diagnosis not present

## 2021-09-21 DIAGNOSIS — R1011 Right upper quadrant pain: Secondary | ICD-10-CM | POA: Diagnosis not present

## 2021-09-21 DIAGNOSIS — K229 Disease of esophagus, unspecified: Secondary | ICD-10-CM

## 2021-09-21 MED ORDER — SUCRALFATE 1 GM/10ML PO SUSP: 1.0000 g | Freq: Two times a day (BID) | ORAL | 1 refills | Status: AC

## 2021-09-21 MED ORDER — SODIUM CHLORIDE 0.9 % IV SOLN: 500.0000 mL | Freq: Once | INTRAVENOUS | Status: DC

## 2021-09-21 NOTE — Progress Notes (Unsigned)
Pt non-responsive, VVS, Report to RN  °

## 2021-09-21 NOTE — Progress Notes (Unsigned)
GASTROENTEROLOGY PROCEDURE H&P NOTE   Primary Care Physician: Cher Nakai, MD  HPI: Dawn Hartman is a 26 y.o. female who presents for EGD for evaluation of RUQ pain as well as changes in bowel habits.  Past Medical History:  Diagnosis Date   Acromioclavicular joint separation 12/08/2017   Anemia    Anxiety    Asthma    Interm   Breast mass, right 53/6644   Complication of anesthesia    Depression    Dislocation of left shoulder joint    AC seperation   Family history of adverse reaction to anesthesia    mother vomits while under anesthesia   Food allergy    Headache    High triglycerides    takes fish oil supplement   History of epistaxis    2-3 hours   History of MRSA infection age 17   finger   Hyperlipidemia    Hypothyroidism    Ileitis 01/15/2014   Mesenteric adenitis 01/18/2017   Other fatigue    Ovarian cyst    PONV (postoperative nausea and vomiting)    Shortness of breath    Shortness of breath on exertion    Vasovagal episode    during nosebleed   Past Surgical History:  Procedure Laterality Date   ADENOIDECTOMY     BREAST LUMPECTOMY WITH NEEDLE LOCALIZATION  04/15/2012   Procedure: BREAST LUMPECTOMY WITH NEEDLE LOCALIZATION;  Surgeon: Rolm Bookbinder, MD;  Location: [place];  Service: General;  Laterality: Right;  right breast wire localized mass excision   BREAST SURGERY     INCISION AND DRAINAGE Left 01/22/2018   Procedure: INCISION AND DRAINAGE LEFT SHOULDER;  Surgeon: [name], MD;  Location: [place];  Service: Orthopedics;  Laterality: Left;   MYRINGOTOMY  04/1999; 04/2002   NASAL CAUTERIZATION  Bilateral    RECONSTRUCTION OF CORACOCLAVICULAR LIGAMENT Left 12/18/2017   Procedure: RECONSTRUCTION OF CORACOCLAVICULAR LIGAMENT LEFT SHOULDER;  Surgeon: [name], MD;  Location: [place];  Service: Orthopedics;  Laterality: Left;   TONSILLECTOMY  03/2010   Current Outpatient  Medications  Medication Sig Dispense Refill   albuterol (PROVENTIL HFA;VENTOLIN HFA) 108 (90 Base) MCG/ACT inhaler Inhale 1-2 puffs into the lungs every 6 (six) hours as needed for wheezing or shortness of breath. 1 Inhaler 0   escitalopram (LEXAPRO) 10 MG tablet Take 1 tablet by mouth daily.     hydrOXYzine (ATARAX/VISTARIL) 25 MG tablet Take 1 tablet (25 mg total) by mouth every 6 (six) hours as needed for anxiety. 30 tablet 0   levothyroxine (SYNTHROID) 50 MCG tablet Take 50 mcg by mouth daily before breakfast.     omeprazole (PRILOSEC) 20 MG capsule TAKE 1 CAPSULE (20 MG TOTAL) BY MOUTH DAILY. IN THE AM 30 capsule 1   sertraline (ZOLOFT) 50 MG tablet Take 1 tablet (50 mg total) by mouth daily. For mood control 30 tablet 0   triamcinolone cream (KENALOG) 0.1 % Apply 1 application topically as needed.     Current Facility-Administered Medications  Medication Dose Route Frequency Provider Last Rate Last Admin   0.9 %  sodium chloride infusion  500 mL Intravenous Once Mansouraty, Telford Nab., MD        Current Outpatient Medications:    albuterol (PROVENTIL HFA;VENTOLIN HFA) 108 (90 Base) MCG/ACT inhaler, Inhale 1-2 puffs into the lungs every 6 (six) hours as needed for wheezing or shortness of breath., Disp: 1 Inhaler, Rfl: 0   escitalopram (LEXAPRO) 10  MG tablet, Take 1 tablet by mouth daily., Disp: , Rfl:    hydrOXYzine (ATARAX/VISTARIL) 25 MG tablet, Take 1 tablet (25 mg total) by mouth every 6 (six) hours as needed for anxiety., Disp: 30 tablet, Rfl: 0   levothyroxine (SYNTHROID) 50 MCG tablet, Take 50 mcg by mouth daily before breakfast., Disp: , Rfl:    omeprazole (PRILOSEC) 20 MG capsule, TAKE 1 CAPSULE (20 MG TOTAL) BY MOUTH DAILY. IN THE AM, Disp: 30 capsule, Rfl: 1   sertraline (ZOLOFT) 50 MG tablet, Take 1 tablet (50 mg total) by mouth daily. For mood control, Disp: 30 tablet, Rfl: 0   triamcinolone cream (KENALOG) 0.1 %, Apply 1 application topically as needed., Disp: , Rfl:    Current Facility-Administered Medications:    0.9 %  sodium chloride infusion, 500 mL, Intravenous, Once, Mansouraty, Telford Nab., MD Allergies  Allergen Reactions   Adhesive [Tape]     Paper tape only   Freederm Adhesive Remover [New Skin]    Pineapple    Latex Rash   Family History  Problem Relation Age of Onset   Anesthesia problems Mother        post-op N/V   Anxiety disorder Mother    Hypertension Father    Hyperlipidemia Father    Sleep apnea Father    Asthma Sister    Diabetes Maternal Grandmother    Hypertension Maternal Grandmother    Leukemia Maternal Grandfather    Cancer Paternal Grandmother        breast CA   Breast cancer Paternal Grandmother    Heart disease Paternal Grandfather        atrial fib.   Asthma Paternal Grandfather        as a child   Breast cancer Maternal Great-grandmother    Heart attack Maternal Great-grandfather    Colon cancer Neg Hx    Colon polyps Neg Hx    Pancreatic cancer Neg Hx    Stomach cancer Neg Hx    Social History   Socioeconomic History   Marital status: Significant Other    Spouse name: Liane Comber   Number of children: Not on file   Years of education: Not on file   Highest education level: Not on file  Occupational History   Occupation: front Engineer, mining: [employer]  Tobacco Use   Smoking status: Never   Smokeless tobacco: Never  Vaping Use   Vaping Use: Former  Substance and Sexual Activity   Alcohol use: Not Currently   Drug use: No   Sexual activity: Never    Birth control/protection: Abstinence  Other Topics Concern   Not on file  Social History Narrative   Not on file   Social Determinants of Health   Financial Resource Strain: Not on file  Food Insecurity: Not on file  Transportation Needs: Not on file  Physical Activity: Not on file  Stress: Not on file  Social Connections: Not on file  Intimate Partner Violence: Not on file    Physical Exam: Today's Vitals    09/21/21 1507  BP: 124/76  Pulse: 85  SpO2: 99%  Weight: 211 lb (95.7 kg)  Height: '5\' 6"'$  (1.676 m)   Body mass index is 34.06 kg/m. GEN: NAD EYE: Sclerae anicteric ENT: MMM CV: Non-tachycardic GI: Soft, NT/ND NEURO:  Alert & Oriented x 3  Lab Results: No results for input(s): WBC, HGB, HCT, PLT in the last 72 hours. BMET No results for input(s): NA, K, CL, CO2,  GLUCOSE, BUN, CREATININE, CALCIUM in the last 72 hours. LFT No results for input(s): PROT, ALBUMIN, AST, ALT, ALKPHOS, BILITOT, BILIDIR, IBILI in the last 72 hours. PT/INR No results for input(s): LABPROT, INR in the last 72 hours.   Impression / Plan: This is a 26 y.o.female who presents for EGD for evaluation of RUQ pain as well as changes in bowel habits.  The risks and benefits of endoscopic evaluation/treatment were discussed with the patient and/or family; these include but are not limited to the risk of perforation, infection, bleeding, missed lesions, lack of diagnosis, severe illness requiring hospitalization, as well as anesthesia and sedation related illnesses.  The patient's history has been reviewed, patient examined, no change in status, and deemed stable for procedure.  The patient and/or family is agreeable to proceed.    Justice Britain, MD [facility] Advanced Endoscopy Office # [phone]

## 2021-09-21 NOTE — Patient Instructions (Addendum)
Resume previous diet and continue present medications. 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 Recommended to consider Carafate therapy twice daily for trial if patient desires and trial of FDgard as well (not at same time as Carafate) if possible. Pick up prescription for [facility] from [facility] in [place] - take 1gram twice a day and try to take medication with meals  Await pathology results.   YOU HAD AN ENDOSCOPIC PROCEDURE TODAY AT [facility]:   Refer to the procedure report that was given to you for any specific questions about what was found during the examination.  If the procedure report does not answer your questions, please call your gastroenterologist to clarify.  If you requested that your care partner not be given the details of your procedure findings, then the procedure report has been included in a sealed envelope for you to review at your convenience later.  YOU SHOULD EXPECT: Some feelings of bloating in the abdomen. Passage of more gas than usual.  Walking can help get rid of the air that was put into your GI tract during the procedure and reduce the bloating. If you had a lower endoscopy (such as a colonoscopy or flexible sigmoidoscopy) you may notice spotting of blood in your stool or on the toilet paper. If you underwent a bowel prep for your procedure, you may not have a normal bowel movement for a few days.  Please Note:  You might notice some irritation and congestion in your nose or some drainage.  This is from the oxygen used during your procedure.  There is no need for concern and it should clear up in a day or so.  SYMPTOMS TO REPORT IMMEDIATELY:  Following upper endoscopy (EGD)  Vomiting of blood or coffee ground material  New chest pain or pain under the shoulder blades  Painful or persistently difficult swallowing  New shortness of breath  Fever of 100F or higher  Black, tarry-looking  stools  For urgent or emergent issues, a gastroenterologist can be reached at any hour by calling [phone]. Do not use MyChart messaging for urgent concerns.    DIET:  We do recommend a small meal at first, but then you may proceed to your regular diet.  Drink plenty of fluids but you should avoid alcoholic beverages for 24 hours.  ACTIVITY:  You should plan to take it easy for the rest of today and you should NOT DRIVE or use heavy machinery until tomorrow (because of the sedation medicines used during the test).    FOLLOW UP: Our staff will call the number listed on your records 48-72 hours following your procedure to check on you and address any questions or concerns that you may have regarding the information given to you following your procedure. If we do not reach you, we will leave a message.  We will attempt to reach you two times.  During this call, we will ask if you have developed any symptoms of COVID 19. If you develop any symptoms (ie: fever, flu-like symptoms, shortness of breath, cough etc.) before then, please call [phone].  If you test positive for Covid 19 in the 2 weeks post procedure, please call and report this information to [place].    If any biopsies were taken you will be contacted by phone or by letter within the next 1-3 weeks.  Please call us at [phone] if you have not heard about the biopsies in 3 weeks.  SIGNATURES/CONFIDENTIALITY: You and/or your care partner have signed paperwork which will be entered into your electronic medical record.  These signatures attest to the fact that that the information above on your After Visit Summary has been reviewed and is understood.  Full responsibility of the confidentiality of this discharge information lies with you and/or your care-partner.

## 2021-09-21 NOTE — Progress Notes (Signed)
Pt's states no medical or surgical changes since previsit or office visit. 

## 2021-09-21 NOTE — Op Note (Signed)
[facility] Patient Name: Dawn Hartman Procedure Date: 09/21/2021 3:23 PM MRN: [id number] Endoscopist: Justice Britain , MD Age: 26 Referring MD:  Date of Birth: 1995/08/23 Gender: Female Account #: [id number] Procedure:                Upper GI endoscopy Indications:              Abdominal pain in the right upper quadrant, Diarrhea Medicines:                Monitored Anesthesia Care Procedure:                Pre-Anesthesia Assessment:                           - Prior to the procedure, a History and Physical                            was performed, and patient medications and                            allergies were reviewed. The patient's tolerance of                            previous anesthesia was also reviewed. The risks                            and benefits of the procedure and the sedation                            options and risks were discussed with the patient.                            All questions were answered, and informed consent                            was obtained. Prior Anticoagulants: The patient has                            taken no previous anticoagulant or antiplatelet                            agents. ASA Grade Assessment: II - A patient with                            mild systemic disease. After reviewing the risks                            and benefits, the patient was deemed in                            satisfactory condition to undergo the procedure.                           After obtaining informed consent, the endoscope was  passed under direct vision. Throughout the                            procedure, the patient's blood pressure, pulse, and                            oxygen saturations were monitored continuously. The                            GIF [id number] #[id number] was introduced through the                            mouth, and advanced to the second part of duodenum.                             The upper GI endoscopy was accomplished without                            difficulty. The patient tolerated the procedure. Scope In: Scope Out: Findings:                 No gross lesions were noted in the entire                            esophagus. Biopsies were taken with a cold forceps                            for histology to rule out EoE/LoE.                           The Z-line was regular and was found 38 cm from the                            incisors.                           A non-obstructing Schatzki ring was found at the                            gastroesophageal junction.                           A 1 cm hiatal hernia was present.                           No gross lesions were noted in the entire examined                            stomach. Biopsies were taken with a cold forceps                            for histology and Helicobacter pylori testing.                           No gross  lesions were noted in the duodenal bulb,                            in the first portion of the duodenum and in the                            second portion of the duodenum. Complications:            No immediate complications. Estimated Blood Loss:     Estimated blood loss was minimal. Impression:               - No gross lesions in esophagus. Biopsied.                           - Z-line regular, 38 cm from the incisors.                           - Non-obstructing Schatzki ring.                           - 1 cm hiatal hernia.                           - No gross lesions in the stomach. Biopsied.                           - No gross lesions in the duodenal bulb, in the                            first portion of the duodenum and in the second                            portion of the duodenum. Recommendation:           - The patient will be observed post-procedure,                            until all discharge criteria are met.                           - Discharge patient to home.                            - Patient has a contact number available for                            emergencies. The signs and symptoms of potential                            delayed complications were discussed with the                            patient. Return to normal activities tomorrow.  Written discharge instructions were provided to the                            patient.                           - Resume previous diet.                           - Await pathology results.                           - Continue present medications.                           - Consider Carafate therapy twice daily for a trial                            if patient desires.                           - Observe patient's clinical course.                           - Recommend a trial of FDgard as well (not at same                            time as Carafate) if possible to see if possible.                           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 I cannot say 100% her pain will                            improve with this, but it is the only thing that so                            far has been found in her workup that has been                            noted.                           - The findings and recommendations were discussed                            with the patient.                           - The findings and recommendations were discussed  with the patient's family. Justice Britain, MD 09/21/2021 4:07:59 PM

## 2021-09-21 NOTE — Progress Notes (Signed)
Called to room to assist during endoscopic procedure.  Patient ID and intended procedure confirmed with present staff. Received instructions for my participation in the procedure from the performing physician.  

## 2021-09-22 ENCOUNTER — Telehealth: Payer: Self-pay | Admitting: *Deleted

## 2021-09-22 NOTE — Telephone Encounter (Signed)
  Follow up Call-     09/21/2021    3:16 PM  Call back number  Post procedure Call Back phone  # [phone]  Permission to leave phone message Yes     Patient questions:  Do you have a fever, pain , or abdominal swelling? No. Pain Score  0 *  Have you tolerated food without any problems? Yes.    Have you been able to return to your normal activities? Yes.    Do you have any questions about your discharge instructions: Diet   No. Medications  No. Follow up visit  No.  Do you have questions or concerns about your Care? No.  Actions: * If pain score is 4 or above: No action needed, pain <4.

## 2021-09-28 ENCOUNTER — Encounter: Payer: Self-pay | Admitting: Gastroenterology

## 2021-10-13 ENCOUNTER — Other Ambulatory Visit: Payer: Self-pay | Admitting: Physician Assistant

## 2021-10-27 ENCOUNTER — Other Ambulatory Visit: Payer: Self-pay | Admitting: Physician Assistant

## 2021-11-30 ENCOUNTER — Encounter (INDEPENDENT_AMBULATORY_CARE_PROVIDER_SITE_OTHER): Payer: Self-pay

## 2022-03-01 ENCOUNTER — Ambulatory Visit: Payer: Managed Care, Other (non HMO) | Admitting: Internal Medicine

## 2022-03-01 NOTE — Progress Notes (Deleted)
Name: Dawn Hartman  MRN/ DOB: [id number], Jan 23, 1996    Age/ Sex: 26 y.o., female    PCP: Cher Nakai, MD   Reason for Endocrinology Evaluation: hypothyroidism     Date of Initial Endocrinology Evaluation: 03/01/2022     HPI: Ms. Dawn Hartman is a 26 y.o. female with a past medical history of ***. The patient presented for initial endocrinology clinic visit on 03/01/2022 for consultative assistance with her Hypothyroidism.   During evaluation by Gyn in 06/2021 she was noted with elevated TSH at 18 uIU/mL   HISTORY:  Past Medical History:  Past Medical History:  Diagnosis Date   Acromioclavicular joint separation 12/08/2017   Anemia    Anxiety    Asthma    Interm   Breast mass, right 16/9678   Complication of anesthesia    Depression    Dislocation of left shoulder joint    AC seperation   Family history of adverse reaction to anesthesia    mother vomits while under anesthesia   Food allergy    Headache    High triglycerides    takes fish oil supplement   History of epistaxis    2-3 hours   History of MRSA infection age 85   finger   Hyperlipidemia    Hypothyroidism    Ileitis 01/15/2014   Mesenteric adenitis 01/18/2017   Other fatigue    Ovarian cyst    PONV (postoperative nausea and vomiting)    Shortness of breath    Shortness of breath on exertion    Vasovagal episode    during nosebleed   Past Surgical History:  Past Surgical History:  Procedure Laterality Date   ADENOIDECTOMY     BREAST LUMPECTOMY WITH NEEDLE LOCALIZATION  04/15/2012   Procedure: BREAST LUMPECTOMY WITH NEEDLE LOCALIZATION;  Surgeon: Rolm Bookbinder, MD;  Location: [facility];  Service: General;  Laterality: Right;  right breast wire localized mass excision   BREAST SURGERY     INCISION AND DRAINAGE Left 01/22/2018   Procedure: INCISION AND DRAINAGE LEFT SHOULDER;  Surgeon: [name], MD;  Location: [facility];  Service: Orthopedics;   Laterality: Left;   MYRINGOTOMY  04/1999; 04/2002   NASAL CAUTERIZATION  Bilateral    RECONSTRUCTION OF CORACOCLAVICULAR LIGAMENT Left 12/18/2017   Procedure: RECONSTRUCTION OF CORACOCLAVICULAR LIGAMENT LEFT SHOULDER;  Surgeon: [name], MD;  Location: [facility];  Service: Orthopedics;  Laterality: Left;   TONSILLECTOMY  03/2010    Social History:  reports that she has never smoked. She has never used smokeless tobacco. She reports that she does not currently use alcohol. She reports that she does not use drugs. Family History: family history includes Anesthesia problems in her mother; Anxiety disorder in her mother; Asthma in her paternal grandfather and sister; Breast cancer in her maternal great-grandmother and paternal grandmother; Cancer in her paternal grandmother; Diabetes in her maternal grandmother; Heart attack in her maternal great-grandfather; Heart disease in her paternal grandfather; Hyperlipidemia in her father; Hypertension in her father and maternal grandmother; Leukemia in her maternal grandfather; Sleep apnea in her father.   HOME MEDICATIONS: Allergies as of 03/01/2022       Reactions   Adhesive [tape]    Paper tape only   Freederm Adhesive Remover [new Skin]    Pineapple    Latex Rash        Medication List        Accurate as of March 01, 2022  7:11 AM. If you have any questions, ask your nurse or doctor.          albuterol 108 (90 Base) MCG/ACT inhaler Commonly known as: VENTOLIN HFA Inhale 1-2 puffs into the lungs every 6 (six) hours as needed for wheezing or shortness of breath.   escitalopram 10 MG tablet Commonly known as: LEXAPRO Take 1 tablet by mouth daily.   hydrOXYzine 25 MG tablet Commonly known as: ATARAX Take 1 tablet (25 mg total) by mouth every 6 (six) hours as needed for anxiety.   levothyroxine 50 MCG tablet Commonly known as: SYNTHROID Take 50 mcg by mouth daily before breakfast.   omeprazole 20 MG  capsule Commonly known as: PRILOSEC TAKE 1 CAPSULE (20 MG TOTAL) BY MOUTH DAILY. IN THE AM   sertraline 50 MG tablet Commonly known as: ZOLOFT Take 1 tablet (50 mg total) by mouth daily. For mood control   sucralfate 1 GM/10ML suspension Commonly known as: CARAFATE Take 10 mLs (1 g total) by mouth 2 (two) times daily. Try to take medication with meals   triamcinolone cream 0.1 % Commonly known as: KENALOG Apply 1 application topically as needed.          REVIEW OF SYSTEMS: A comprehensive ROS was conducted with the patient and is negative except as per HPI and below:  ROS     OBJECTIVE:  VS: There were no vitals taken for this visit.   Wt Readings from Last 3 Encounters:  09/21/21 211 lb (95.7 kg)  08/19/21 211 lb 2 oz (95.8 kg)  09/22/20 192 lb (87.1 kg)     EXAM: General: Pt appears well and is in NAD  Eyes: External eye exam normal without stare, lid lag or exophthalmos.  EOM intact.  PERRL.  Neck: General: Supple without adenopathy. Thyroid: Thyroid size normal.  No goiter or nodules appreciated. No thyroid bruit.  Lungs: Clear with good BS bilat with no rales, rhonchi, or wheezes  Heart: Auscultation: RRR.  Abdomen: Normoactive bowel sounds, soft, nontender, without masses or organomegaly palpable  Extremities:  BL LE: No pretibial edema normal ROM and strength.  Mental Status: Judgment, insight: Intact Orientation: Oriented to time, place, and person Mood and affect: No depression, anxiety, or agitation     DATA REVIEWED: ***    ASSESSMENT/PLAN/RECOMMENDATIONS:   Hypothyroidism:    Medications :  Signed electronically by: Mack Guise, MD  [facility]  [facility] [address] [place], [place] [address] Phone: [phone] FAX: [phone]   CC: Cher Nakai, MD [address] [place] [address] Phone: [phone] Fax: [phone]   Return to Endocrinology clinic as  below: Future Appointments  Date Time Provider [place]  03/01/2022 11:30 AM Edouard Gikas, Melanie Crazier, MD [id number] None

## 2023-11-26 ENCOUNTER — Other Ambulatory Visit (HOSPITAL_BASED_OUTPATIENT_CLINIC_OR_DEPARTMENT_OTHER): Payer: Self-pay | Admitting: Internal Medicine

## 2023-11-26 DIAGNOSIS — R7989 Other specified abnormal findings of blood chemistry: Secondary | ICD-10-CM

## 2024-01-28 ENCOUNTER — Encounter (HOSPITAL_BASED_OUTPATIENT_CLINIC_OR_DEPARTMENT_OTHER): Payer: Self-pay
